# Patient Record
Sex: Male | Born: 1990 | Race: White | Hispanic: No | Marital: Single | Smoking: Current every day smoker
Health system: Southern US, Community
[De-identification: ages and names within clinical notes are randomized; demographics above are authoritative.]

## PROBLEM LIST (undated history)

## (undated) ENCOUNTER — Emergency Department (HOSPITAL_COMMUNITY): Admission: EM | Payer: 59 | Source: Home / Self Care

## (undated) DIAGNOSIS — K219 Gastro-esophageal reflux disease without esophagitis: Secondary | ICD-10-CM

---

## 2013-01-10 ENCOUNTER — Other Ambulatory Visit: Payer: Self-pay | Admitting: *Deleted

## 2013-01-10 ENCOUNTER — Ambulatory Visit (INDEPENDENT_AMBULATORY_CARE_PROVIDER_SITE_OTHER): Payer: No Typology Code available for payment source | Admitting: Emergency Medicine

## 2013-01-10 VITALS — BP 112/72 | HR 72 | Temp 98.5°F | Resp 16 | Ht 64.0 in | Wt 133.0 lb

## 2013-01-10 DIAGNOSIS — Z2089 Contact with and (suspected) exposure to other communicable diseases: Secondary | ICD-10-CM

## 2013-01-10 DIAGNOSIS — Z202 Contact with and (suspected) exposure to infections with a predominantly sexual mode of transmission: Secondary | ICD-10-CM

## 2013-01-10 MED ORDER — CEFPODOXIME PROXETIL 200 MG PO TABS: 400.0000 mg | ORAL_TABLET | Freq: Once | ORAL | Status: DC

## 2013-01-10 MED ORDER — DOXYCYCLINE HYCLATE 100 MG PO CAPS: 100.0000 mg | ORAL_CAPSULE | Freq: Two times a day (BID) | ORAL | Status: DC

## 2013-01-10 NOTE — Patient Instructions (Addendum)
Safer Sex  Your caregiver wants you to have this information about the infections that can be transmitted from sexual contact and how to prevent them. The idea behind safer sex is that you can be sexually active, and at the same time reduce the risk of giving or getting a sexually transmitted disease (STD). Every person should be aware of how to prevent him or herself and his or her sex partner from getting an STD.  CAUSES OF STDS  STDs are transmitted by sharing body fluids, which contain viruses and bacteria. The following fluids all transmit infections during sexual intercourse and sex acts:  · Semen.  · Saliva.  · Urine.  · Blood.  · Vaginal mucus.  Examples of STDs include:  · Chlamydia.  · Gonorrhea.  · Genital herpes.  · Hepatitis B.  · Human immunodeficiency virus or acquired immunodeficiency syndrome (HIV or AIDS).  · Syphilis.  · Trichomonas.  · Pubic lice.  · Human papillomavirus (HPV), which may include:  · Genital warts.  · Cervical dysplasia.  · Cervical cancer (can develop with certain types of HPV).  SYMPTOMS   Sexual diseases often cause few or no symptoms until they are advanced, so a person can be infected and spread the infection without knowing it. Some STDs respond to treatment very well. Others, like HIV and herpes, cannot be cured, but are treated to reduce their effects.  Specific symptoms include:  · Abnormal vaginal discharge.  · Irritation or itching in and around the vagina, and in the pubic hair.  · Pain during sexual intercourse.  · Bleeding during sexual intercourse.  · Pelvic or abdominal pain.  · Fever.  · Growths in and around the vagina.  · An ulcer in or around the vagina.  · Swollen glands in the groin area.  DIAGNOSIS   · Blood tests.  · Pap test.  · Culture test of abnormal vaginal discharge.  · A test that applies a solution and examines the cervix with a lighted magnifying scope (colposcopy).  · A test that examines the pelvis with a lighted tube, through a small incision  (laparoscopy).  TREATMENT   The treatment will depend on the cause of the STD.  · Antibiotic treatment by injection, oral, creams, or suppositories in the vagina.  · Over-the-counter medicated shampoo, to get rid of pubic lice.  · Removing or treating growths with medicine, freezing, burning (electrocautery), or surgery.  · Surgery treatment for HPV of the cervix.  · Supportive medicines for herpes, HIV, AIDS, and hepatitis.  Being careful cannot eliminate all risk of infection, but sex can be made much safer.  Safe sexual practices include body massage and gentle touching. Masturbation is safe, as long as body fluids do not contact skin that has sores or cuts. Dry kissing and oral sex on a man wearing a latex condom or on a woman wearing a male condom is also safe. Slightly less safe is intercourse while the man wears a latex condom or wet kissing. It is also safer to have one sex partner that you know is not having sex with anyone else.  LENGTH OF ILLNESS  An STD might be treated and cured in a week, sometimes a month, or more. And it can linger with symptoms for many years. STDs can also cause damage to the male organs. This can cause chronic pain, infertility, and recurrence of the STD, especially herpes, hepatitis, HIV, and HPV.  HOME CARE INSTRUCTIONS AND PREVENTION  · Alcohol   and recreational drugs are often the reason given for not practicing safer sex. These substances affect your judgment. Alcohol and recreational drugs can also impair your immune system, making you more vulnerable to disease.  · Do not engage in risky and dangerous sexual practices, including:  · Vaginal or anal sex without a condom.  · Oral sex on a man without a condom.  · Oral sex on a woman without a male condom.  · Using saliva to lubricate a condom.  · Any other sexual contact in which body fluids or blood from one partner contact the other partner.  · You should use only latex condoms for men and water soluble lubricants.  Petroleum based lubricants or oils used to lubricate a condom will weaken the condom and increase the chance that it will break.  · Think very carefully before having sex with anyone who is high risk for STDs and HIV. This includes IV drug users, people with multiple sexual partners, or people who have had an STD, or a positive hepatitis or HIV blood test.  · Remember that even if your partner has had only one previous partner, their previous partner might have had multiple partners. If so, you are at high risk of being exposed to an STD. You and your sex partner should be the only sex partners with each other, with no one else involved.  · A vaccine is available for hepatitis B and HPV through your caregiver or the Public Health Department. Everyone should be vaccinated with these vaccines.  · Avoid risky sex practices. Sex acts that can break the skin make you more likely to get an STD.  SEEK MEDICAL CARE IF:   · If you think you have an STD, even if you do not have any symptoms. Contact your caregiver for evaluation and treatment, if needed.  · You think or know your sex partner has acquired an STD.  · You have any of the symptoms mentioned above.  Document Released: 12/16/2004 Document Revised: 01/31/2012 Document Reviewed: 10/08/2009  [employer]® Patient Information ©2013 [employer].

## 2013-01-10 NOTE — Addendum Note (Signed)
Addended by: Carmelina Dane on: 01/10/2013 02:44 PM   Modules accepted: Orders

## 2013-01-10 NOTE — Progress Notes (Addendum)
[facility] [address], [place] [address] [phone]  Date:  01/10/2013   Name:  Charles Barron   DOB:  1991-04-03   MRN:  [id number]  PCP:  No primary provider on file.    Chief Complaint: Exposure to STD   History of Present Illness:  Charles Barron is a 22 y.o. very pleasant male patient who presents with the following:  Asymptomatic following exposure to chlamydia.  His sexual partner was treated for chlamydia and they have had unprotected sex.  He is asymptomatic and has never had an STD.  Denies rash, blisters, discharge or dysuria.  No other complaints or health concerns.  Counseled regarding STD and safe sex practices  There is no problem list on file for this patient.   History reviewed. No pertinent past medical history.  History reviewed. No pertinent past surgical history.  History  Substance Use Topics  . Smoking status: Never Smoker   . Smokeless tobacco: Not on file  . Alcohol Use: Yes    No family history on file.  No Known Allergies  Medication list has been reviewed and updated.  No current outpatient prescriptions on file prior to visit.   No current facility-administered medications on file prior to visit.    Review of Systems:  As per HPI, otherwise negative.    Physical Examination: Filed Vitals:   01/10/13 1433  BP: 112/72  Pulse: 72  Temp: 98.5 F (36.9 C)  Resp: 16   Filed Vitals:   01/10/13 1433  Height: 5\' 4"  (1.626 m)  Weight: 133 lb (60.328 kg)   Body mass index is 22.82 kg/(m^2). Ideal Body Weight: Weight in (lb) to have BMI = 25: 145.3   GEN: WDWN, NAD, Non-toxic, Alert & Oriented x 3 HEENT: Atraumatic, Normocephalic.  Ears and Nose: No external deformity. EXTR: No clubbing/cyanosis/edema NEURO: Normal gait.  PSYCH: Normally interactive. Conversant. Not depressed or anxious appearing.  Calm demeanor.  Genitalia normal male  Assessment and Plan: STD exposure  Carmelina Dane,  MD  Requests dermatology referral for a facial rash

## 2013-01-11 LAB — HIV ANTIBODY (ROUTINE TESTING W REFLEX): HIV: NONREACTIVE

## 2015-04-12 ENCOUNTER — Encounter (HOSPITAL_COMMUNITY): Payer: Self-pay | Admitting: *Deleted

## 2015-04-12 ENCOUNTER — Emergency Department (HOSPITAL_COMMUNITY)
Admission: EM | Admit: 2015-04-12 | Discharge: 2015-04-13 | Disposition: A | Discharge status: Home / Self Care | Payer: 59 | Attending: Emergency Medicine | Admitting: Emergency Medicine

## 2015-04-12 ENCOUNTER — Emergency Department (HOSPITAL_COMMUNITY): Payer: 59

## 2015-04-12 DIAGNOSIS — R110 Nausea: Secondary | ICD-10-CM | POA: Insufficient documentation

## 2015-04-12 DIAGNOSIS — N200 Calculus of kidney: Secondary | ICD-10-CM | POA: Diagnosis not present

## 2015-04-12 DIAGNOSIS — K4090 Unilateral inguinal hernia, without obstruction or gangrene, not specified as recurrent: Secondary | ICD-10-CM | POA: Insufficient documentation

## 2015-04-12 DIAGNOSIS — Z792 Long term (current) use of antibiotics: Secondary | ICD-10-CM | POA: Diagnosis not present

## 2015-04-12 DIAGNOSIS — R1031 Right lower quadrant pain: Secondary | ICD-10-CM | POA: Diagnosis present

## 2015-04-12 MED ORDER — ONDANSETRON 4 MG PO TBDP
4.0000 mg | ORAL_TABLET | Freq: Once | ORAL | Status: DC
  Filled 2015-04-12: qty 1

## 2015-04-12 MED ORDER — OXYCODONE-ACETAMINOPHEN 5-325 MG PO TABS: 1.0000 | ORAL_TABLET | ORAL | Status: DC | PRN

## 2015-04-12 MED ORDER — OXYCODONE-ACETAMINOPHEN 5-325 MG PO TABS
2.0000 | ORAL_TABLET | Freq: Once | ORAL | Status: AC
  Administered 2015-04-12: 2 via ORAL
  Filled 2015-04-12: qty 2

## 2015-04-12 MED ORDER — ONDANSETRON 4 MG PO TBDP: 4.0000 mg | ORAL_TABLET | Freq: Three times a day (TID) | ORAL | Status: DC | PRN

## 2015-04-12 NOTE — ED Provider Notes (Signed)
TIME SEEN: 11:06 PM  CHIEF COMPLAINT: Inguinal Hernia  HPI: HPI Comments: Charles Barron is a 24 y.o. male who presents to the Emergency Department complaining of worsening pain to his right inguinal area that began 1 day ago. Pt reports Hx of right inguinal hernia for the past 12 years. Pt does report slight nausea, but states this is due to not eating as much today. 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 Pt states that he has tried percocet with slight relief. Pt denies fever, chills, vomiting, diarrhea, blood in stool, dysuria, hematuria, or penile discharge. Pt denies Hx of abdominal surgeries. Has not followed up with a surgeon for this hernia.   ROS: See HPI Constitutional: no fever  Eyes: no drainage  ENT: no runny nose   Cardiovascular:  no chest pain  Resp: no SOB  GI: no vomiting GU: no dysuria Integumentary: no rash  Allergy: no hives  Musculoskeletal: no leg swelling  Neurological: no slurred speech ROS otherwise negative  PAST MEDICAL HISTORY/PAST SURGICAL HISTORY:  History reviewed. No pertinent past medical history.  MEDICATIONS:  Prior to Admission medications   Medication Sig Start Date End Date Taking? Authorizing Provider  cefpodoxime (VANTIN) 200 MG tablet Take 2 tablets (400 mg total) by mouth once. 01/10/13   Carmelina Dane, MD  doxycycline (VIBRAMYCIN) 100 MG capsule Take 1 capsule (100 mg total) by mouth 2 (two) times daily. 01/10/13   Carmelina Dane, MD    ALLERGIES:  No Known Allergies  SOCIAL HISTORY:  History  Substance Use Topics  . Smoking status: Never Smoker   . Smokeless tobacco: Not on file  . Alcohol Use: Yes    FAMILY HISTORY: History reviewed. No pertinent family history.  EXAM: BP 132/85 mmHg  Pulse 84  Temp(Src) 97.8 F (36.6 C) (Oral)  Resp 18  Ht  (1.626 m)  Wt 135 lb (61.236 kg)  BMI 23.16 kg/m2  SpO2 99%   CONSTITUTIONAL: Alert and oriented and responds appropriately to questions.  Well-appearing; well-nourished; non toxic appearing. HEAD: Normocephalic EYES: Conjunctivae clear, PERRL ENT: normal nose; no rhinorrhea; moist mucous membranes; pharynx without lesions noted NECK: Supple, no meningismus, no LAD  CARD: RRR; S1 and S2 appreciated; no murmurs, no clicks, no rubs, no gallops RESP: Normal chest excursion without splinting or tachypnea; breath sounds clear and equal bilaterally; no wheezes, no rhonchi, no rales, no hypoxia or respiratory distress, speaking full sentences ABD/GI: Normal bowel sounds; non-distended; soft, non-tender, no rebound, no guarding, no peritoneal signs; no tenderness at McBurney's point.  GU: circumcised male, normal external genitalia, normal urethra meatus without blood or discharge, no testicular masses or tenderness, pt does have a small right inguinal hernia only present with valsalva, no left inguinal hernia present.  BACK:  The back appears normal and is non-tender to palpation, there is no CVA tenderness EXT: Normal ROM in all joints; non-tender to palpation; no edema; normal capillary refill; no cyanosis, no calf tenderness or swelling    SKIN: Normal color for age and race; warm NEURO: Moves all extremities equally, sensation to light touch intact diffusely, cranial nerves II through XII intact PSYCH: The patient's mood and manner are appropriate. Grooming and personal hygiene are appropriate.  MEDICAL DECISION MAKING: Patient here with right inguinal hernia that is easily reproducible on exam. He is afebrile, hemodynamically stable, nontoxic with benign abdominal exam. No testicular pain or masses. Pain is been intermittent for 12 years. He is requesting surgery outpatient  follow-up information. An abdominal x-ray was ordered in triage and shows a possible 5 mm obstructing stone at the proximal right ureter he has no history of kidney stones and does not clinically appear to have a kidney stone. Have offered to perform labs, urine, CT to  confirm but he declines this at this time which I feel is appropriate given it will not change his disposition given he is so well-appearing. Have given outpatient follow up information for general surgery as well as urology. We'll discharge with prescriptions for Percocet and Zofran. Discussed return precautions. He verbalizes understanding and is comfortable with this plan.    I personally performed the services described in this documentation, which was scribed in my presence. The recorded information has been reviewed and is accurate.    Layla MawKristen N Neilan Rizzo, DO 04/13/15 [phone]

## 2015-04-12 NOTE — ED Notes (Signed)
Pt in c/o increased pain to his right inguinal hernia since Friday, pt states he was loading a washing machine when pain started, increased swelling to area

## 2015-04-12 NOTE — ED Notes (Signed)
Registration at bedside.

## 2015-04-12 NOTE — Discharge Instructions (Signed)
Inguinal Hernia, Adult Muscles help keep everything in the body in its proper place. But if a weak spot in the muscles develops, something can poke through. That is called a hernia. When this happens in the lower part of the belly (abdomen), it is called an inguinal hernia. (It takes its name from a part of the body in this region called the inguinal canal.) A weak spot in the wall of muscles lets some fat or part of the small intestine bulge through. An inguinal hernia can develop at any age. Men get them more often than women. CAUSES  In adults, an inguinal hernia develops over time.  It can be triggered by:  Suddenly straining the muscles of the lower abdomen.  Lifting heavy objects.  Straining to have a bowel movement. Difficult bowel movements (constipation) can lead to this.  Constant coughing. This may be caused by smoking or lung disease.  Being overweight.  Being pregnant.  Working at a job that requires long periods of standing or heavy lifting.  Having had an inguinal hernia before. One type can be an emergency situation. It is called a strangulated inguinal hernia. It develops if part of the small intestine slips through the weak spot and cannot get back into the abdomen. The blood supply can be cut off. If that happens, part of the intestine may die. This situation requires emergency surgery. SYMPTOMS  Often, a small inguinal hernia has no symptoms. It is found when a healthcare provider does a physical exam. Larger hernias usually have symptoms.   In adults, symptoms may include:  A lump in the groin. This is easier to see when the person is standing. It might disappear when lying down.  In men, a lump in the scrotum.  Pain or burning in the groin. This occurs especially when lifting, straining or coughing.  A dull ache or feeling of pressure in the groin.  Signs of a strangulated hernia can include:  A bulge in the groin that becomes very painful and tender to the  touch.  A bulge that turns red or purple.  Fever, nausea and vomiting.  Inability to have a bowel movement or to pass gas. DIAGNOSIS  To decide if you have an inguinal hernia, a healthcare provider will probably do a physical examination.  This will include asking questions about any symptoms you have noticed.  The healthcare provider might feel the groin area and ask you to cough. If an inguinal hernia is felt, the healthcare provider may try to slide it back into the abdomen.  Usually no other tests are needed. TREATMENT  Treatments can vary. The size of the hernia makes a difference. Options include:  Watchful waiting. This is often suggested if the hernia is small and you have had no symptoms.  No medical procedure will be done unless symptoms develop.  You will need to watch closely for symptoms. If any occur, contact your healthcare provider right away.  Surgery. This is used if the hernia is larger or you have symptoms.  Open surgery. This is usually an outpatient procedure (you will not stay overnight in a hospital). An cut (incision) is made through the skin in the groin. The hernia is put back inside the abdomen. The weak area in the muscles is then repaired by herniorrhaphy or hernioplasty. Herniorrhaphy: in this type of surgery, the weak muscles are sewn back together. Hernioplasty: a patch or mesh is used to close the weak area in the abdominal wall.  Laparoscopy.  In this procedure, a surgeon makes small incisions. A thin tube with a tiny video camera (called a laparoscope) is put into the abdomen. The surgeon repairs the hernia with mesh by looking with the video camera and using two long instruments. HOME CARE INSTRUCTIONS   After surgery to repair an inguinal hernia:  You will need to take pain medicine prescribed by your healthcare provider. Follow all directions carefully.  You will need to take care of the wound from the incision.  Your activity will be  restricted for awhile. This will probably include no heavy lifting for several weeks. You also should not do anything too active for a few weeks. When you can return to work will depend on the type of job that you have.  During "watchful waiting" periods, you should:  Maintain a healthy weight.  Eat a diet high in fiber (fruits, vegetables and whole grains).  Drink plenty of fluids to avoid constipation. This means drinking enough water and other liquids to keep your urine clear or pale yellow.  Do not lift heavy objects.  Do not stand for long periods of time.  Quit smoking. This should keep you from developing a frequent cough. SEEK MEDICAL CARE IF:   A bulge develops in your groin area.  You feel pain, a burning sensation or pressure in the groin. This might be worse if you are lifting or straining.  You develop a fever of more than 100.5 F (38.1 C). SEEK IMMEDIATE MEDICAL CARE IF:   Pain in the groin increases suddenly.  A bulge in the groin gets bigger suddenly and does not go down.  For men, there is sudden pain in the scrotum. Or, the size of the scrotum increases.  A bulge in the groin area becomes red or purple and is painful to touch.  You have nausea or vomiting that does not go away.  You feel your heart beating much faster than normal.  You cannot have a bowel movement or pass gas.  You develop a fever of more than 102.0 F (38.9 C). Document Released: 03/27/2009 Document Revised: 01/31/2012 Document Reviewed: 03/27/2009 [facility] Patient Information 2015 [place], [place]. This information is not intended to replace advice given to you by your health care provider. Make sure you discuss any questions you have with your health care provider.  Dietary Guidelines to Help Prevent Kidney Stones Your risk of kidney stones can be decreased by adjusting the foods you eat. The most important thing you can do is drink enough fluid. You should drink enough fluid to keep  your urine clear or pale yellow. The following guidelines provide specific information for the type of kidney stone you have had. GUIDELINES ACCORDING TO TYPE OF KIDNEY STONE Calcium Oxalate Kidney Stones  Reduce the amount of salt you eat. Foods that have a lot of salt cause your body to release excess calcium into your urine. The excess calcium can combine with a substance called oxalate to form kidney stones.  Reduce the amount of animal protein you eat if the amount you eat is excessive. Animal protein causes your body to release excess calcium into your urine. Ask your dietitian how much protein from animal sources you should be eating.  Avoid foods that are high in oxalates. If you take vitamins, they should have less than 500 mg of vitamin C. Your body turns vitamin C into oxalates. You do not need to avoid fruits and vegetables high in vitamin C. Calcium Phosphate Kidney Stones  Reduce the amount of salt you eat to help prevent the release of excess calcium into your urine.  Reduce the amount of animal protein you eat if the amount you eat is excessive. Animal protein causes your body to release excess calcium into your urine. Ask your dietitian how much protein from animal sources you should be eating.  Get enough calcium from food or take a calcium supplement (ask your dietitian for recommendations). Food sources of calcium that do not increase your risk of kidney stones include:  Broccoli.  Dairy products, such as cheese and yogurt.  Pudding. Uric Acid Kidney Stones  Do not have more than 6 oz of animal protein per day. FOOD SOURCES Animal Protein Sources  Meat (all types).  Poultry.  Eggs.  Fish, seafood. Foods High in [employer] seasonings.  Soy sauce.  Teriyaki sauce.  Cured and processed meats.  Salted crackers and snack foods.  Fast food.  Canned soups and most canned foods. Foods High in Oxalates  Grains:  Amaranth.  Barley.  Grits.  Wheat  germ.  Bran.  Buckwheat flour.  All bran cereals.  Pretzels.  Whole wheat bread.  Vegetables:  Beans (wax).  Beets and beet greens.  Collard greens.  Eggplant.  [place]scarole.  Leeks.  Okra.  Parsley.  Rutabagas.  Spinach.  Swiss chard.  Tomato paste.  Fried potatoes.  Sweet potatoes.  Fruits:  Red currants.  Figs.  Kiwi.  Rhubarb.  Meat and Other Protein Sources:  Beans (dried).  Soy burgers and other soybean products.  Miso.  Nuts (peanuts, almonds, pecans, cashews, hazelnuts).  Nut butters.  Sesame seeds and tahini (paste made of sesame seeds).  Poppy seeds.  Beverages:  Chocolate drink mixes.  Soy milk.  Instant iced tea.  Juices made from high-oxalate fruits or vegetables.  Other:  Carob.  Chocolate.  Fruitcake.  Marmalades. Document Released: 03/05/2011 Document Revised: 11/13/2013 Document Reviewed: 10/05/2013 [facility] Patient Information 2015 [place], [place]. This information is not intended to replace advice given to you by your health care provider. Make sure you discuss any questions you have with your health care provider.  Kidney Stones Kidney stones (urolithiasis) are deposits that form inside your kidneys. The intense pain is caused by the stone moving through the urinary tract. When the stone moves, the ureter goes into spasm around the stone. The stone is usually passed in the urine.  CAUSES   A disorder that makes certain neck glands produce too much parathyroid hormone (primary hyperparathyroidism).  A buildup of uric acid crystals, similar to gout in your joints.  Narrowing (stricture) of the ureter.  A kidney obstruction present at birth (congenital obstruction).  Previous surgery on the kidney or ureters.  Numerous kidney infections. SYMPTOMS   Feeling sick to your stomach (nauseous).  Throwing up (vomiting).  Blood in the urine (hematuria).  Pain that usually spreads (radiates) to the  groin.  Frequency or urgency of urination. DIAGNOSIS   Taking a history and physical exam.  Blood or urine tests.  CT scan.  Occasionally, an examination of the inside of the urinary bladder (cystoscopy) is performed. TREATMENT   Observation.  Increasing your fluid intake.  Extracorporeal shock wave lithotripsy--This is a noninvasive procedure that uses shock waves to break up kidney stones.  Surgery may be needed if you have severe pain or persistent obstruction. There are various surgical procedures. Most of the procedures are performed with the use of small instruments. Only small incisions are needed to accommodate these  instruments, so recovery time is minimized. The size, location, and chemical composition are all important variables that will determine the proper choice of action for you. Talk to your health care provider to better understand your situation so that you will minimize the risk of injury to yourself and your kidney.  HOME CARE INSTRUCTIONS   Drink enough water and fluids to keep your urine clear or pale yellow. This will help you to pass the stone or stone fragments.  Strain all urine through the provided strainer. Keep all particulate matter and stones for your health care provider to see. The stone causing the pain may be as small as a grain of salt. It is very important to use the strainer each and every time you pass your urine. The collection of your stone will allow your health care provider to analyze it and verify that a stone has actually passed. The stone analysis will often identify what you can do to reduce the incidence of recurrences.  Only take over-the-counter or prescription medicines for pain, discomfort, or fever as directed by your health care provider.  Make a follow-up appointment with your health care provider as directed.  Get follow-up X-rays if required. The absence of pain does not always mean that the stone has passed. It may have only  stopped moving. If the urine remains completely obstructed, it can cause loss of kidney function or even complete destruction of the kidney. It is your responsibility to make sure X-rays and follow-ups are completed. Ultrasounds of the kidney can show blockages and the status of the kidney. Ultrasounds are not associated with any radiation and can be performed easily in a matter of minutes. SEEK MEDICAL CARE IF:  You experience pain that is progressive and unresponsive to any pain medicine you have been prescribed. SEEK IMMEDIATE MEDICAL CARE IF:   Pain cannot be controlled with the prescribed medicine.  You have a fever or shaking chills.  The severity or intensity of pain increases over 18 hours and is not relieved by pain medicine.  You develop a new onset of abdominal pain.  You feel faint or pass out.  You are unable to urinate. MAKE SURE YOU:   Understand these instructions.  Will watch your condition.  Will get help right away if you are not doing well or get worse. Document Released: 11/08/2005 Document Revised: 07/11/2013 Document Reviewed: 04/11/2013 [facility] Patient Information 2015 [place], [place]. This information is not intended to replace advice given to you by your health care provider. Make sure you discuss any questions you have with your health care provider.

## 2015-04-12 NOTE — ED Notes (Signed)
Pt states he was lifting a washing machine on Friday and started having pain where his right inguinal hernia is. Reports that he has had this hernia x 12 years

## 2015-04-14 ENCOUNTER — Encounter (HOSPITAL_COMMUNITY): Payer: Self-pay | Admitting: Emergency Medicine

## 2015-04-14 ENCOUNTER — Emergency Department (HOSPITAL_COMMUNITY): Payer: 59

## 2015-04-14 ENCOUNTER — Emergency Department (HOSPITAL_COMMUNITY)
Admission: EM | Admit: 2015-04-14 | Discharge: 2015-04-14 | Disposition: A | Discharge status: Home / Self Care | Payer: 59 | Attending: Emergency Medicine | Admitting: Emergency Medicine

## 2015-04-14 DIAGNOSIS — N200 Calculus of kidney: Secondary | ICD-10-CM | POA: Diagnosis not present

## 2015-04-14 DIAGNOSIS — Z79899 Other long term (current) drug therapy: Secondary | ICD-10-CM | POA: Insufficient documentation

## 2015-04-14 DIAGNOSIS — R109 Unspecified abdominal pain: Secondary | ICD-10-CM | POA: Diagnosis present

## 2015-04-14 LAB — CBC WITH DIFFERENTIAL/PLATELET
BASOS ABS: 0 10*3/uL (ref 0.0–0.1)
BASOS PCT: 1 % (ref 0–1)
EOS PCT: 3 % (ref 0–5)
Eosinophils Absolute: 0.2 10*3/uL (ref 0.0–0.7)
HCT: 43.4 % (ref 39.0–52.0)
HEMOGLOBIN: 15.3 g/dL (ref 13.0–17.0)
LYMPHS ABS: 1.5 10*3/uL (ref 0.7–4.0)
LYMPHS PCT: 26 % (ref 12–46)
MCH: 31.1 pg (ref 26.0–34.0)
MCHC: 35.3 g/dL (ref 30.0–36.0)
MCV: 88.2 fL (ref 78.0–100.0)
MONOS PCT: 12 % (ref 3–12)
Monocytes Absolute: 0.7 10*3/uL (ref 0.1–1.0)
NEUTROS ABS: 3.4 10*3/uL (ref 1.7–7.7)
NEUTROS PCT: 58 % (ref 43–77)
PLATELETS: 215 10*3/uL (ref 150–400)
RBC: 4.92 MIL/uL (ref 4.22–5.81)
RDW: 12.7 % (ref 11.5–15.5)
WBC: 5.8 10*3/uL (ref 4.0–10.5)

## 2015-04-14 LAB — COMPREHENSIVE METABOLIC PANEL
ALT: 14 U/L — ABNORMAL LOW (ref 17–63)
AST: 19 U/L (ref 15–41)
Albumin: 4.2 g/dL (ref 3.5–5.0)
Alkaline Phosphatase: 42 U/L (ref 38–126)
Anion gap: 8 (ref 5–15)
BILIRUBIN TOTAL: 0.9 mg/dL (ref 0.3–1.2)
BUN: 10 mg/dL (ref 6–20)
CALCIUM: 8.8 mg/dL — AB (ref 8.9–10.3)
CHLORIDE: 105 mmol/L (ref 101–111)
CO2: 25 mmol/L (ref 22–32)
CREATININE: 0.85 mg/dL (ref 0.61–1.24)
GFR calc Af Amer: >60 mL/min (ref >60)
Glucose, Bld: 118 mg/dL — ABNORMAL HIGH (ref 65–99)
POTASSIUM: 3.8 mmol/L (ref 3.5–5.1)
Sodium: 138 mmol/L (ref 135–145)
Total Protein: 6.3 g/dL — ABNORMAL LOW (ref 6.5–8.1)

## 2015-04-14 LAB — URINALYSIS, ROUTINE W REFLEX MICROSCOPIC
BILIRUBIN URINE: NEGATIVE
GLUCOSE, UA: NEGATIVE mg/dL
Ketones, ur: NEGATIVE mg/dL
LEUKOCYTES UA: NEGATIVE
NITRITE: NEGATIVE
PROTEIN: NEGATIVE mg/dL
SPECIFIC GRAVITY, URINE: 1.017 (ref 1.005–1.030)
UROBILINOGEN UA: 0.2 mg/dL (ref 0.0–1.0)
pH: 8.5 — ABNORMAL HIGH (ref 5.0–8.0)

## 2015-04-14 LAB — URINE MICROSCOPIC-ADD ON

## 2015-04-14 MED ORDER — HYDROMORPHONE HCL 1 MG/ML IJ SOLN
1.0000 mg | Freq: Once | INTRAMUSCULAR | Status: AC
  Administered 2015-04-14: 1 mg via INTRAVENOUS
  Filled 2015-04-14: qty 1

## 2015-04-14 MED ORDER — KETOROLAC TROMETHAMINE 30 MG/ML IJ SOLN
30.0000 mg | Freq: Once | INTRAMUSCULAR | Status: AC
  Administered 2015-04-14: 30 mg via INTRAVENOUS
  Filled 2015-04-14: qty 1

## 2015-04-14 MED ORDER — FENTANYL CITRATE (PF) 100 MCG/2ML IJ SOLN
50.0000 ug | Freq: Once | INTRAMUSCULAR | Status: AC
  Administered 2015-04-14: 50 ug via INTRAVENOUS
  Filled 2015-04-14: qty 2

## 2015-04-14 MED ORDER — MORPHINE SULFATE 4 MG/ML IJ SOLN
4.0000 mg | Freq: Once | INTRAMUSCULAR | Status: AC
  Administered 2015-04-14: 4 mg via INTRAVENOUS
  Filled 2015-04-14: qty 1

## 2015-04-14 MED ORDER — SODIUM CHLORIDE 0.9 % IV BOLUS (SEPSIS)
1000.0000 mL | Freq: Once | INTRAVENOUS | Status: AC
  Administered 2015-04-14: 1000 mL via INTRAVENOUS

## 2015-04-14 MED ORDER — OXYCODONE-ACETAMINOPHEN 5-325 MG PO TABS: 2.0000 | ORAL_TABLET | ORAL | Status: DC | PRN

## 2015-04-14 MED ORDER — ONDANSETRON HCL 4 MG/2ML IJ SOLN
4.0000 mg | Freq: Once | INTRAMUSCULAR | Status: DC
  Filled 2015-04-14: qty 2

## 2015-04-14 NOTE — ED Notes (Signed)
Patient transported to CT 

## 2015-04-14 NOTE — Discharge Instructions (Signed)
Take Percocet as needed for pain. Refer to attached documents for more information. Return to the ED with worsening or concerning symptoms.  °

## 2015-04-14 NOTE — ED Notes (Signed)
Pt arrives with R sided flank pain and R sided groin pain, woke up with it. Seen yesterday for same. Urinary urgency, nausea.

## 2015-04-14 NOTE — ED Notes (Signed)
Pt sitting on floor, balled over in pain. Pt sts pain 10/10

## 2015-04-14 NOTE — ED Notes (Signed)
Pt noted to be feeling better. Pt laying in bed resting.

## 2015-04-14 NOTE — ED Provider Notes (Signed)
CSN: [id number]     Arrival date & time 04/14/15  4098 History   First MD Initiated Contact with Patient 04/14/15 [phone]     Chief Complaint  Patient presents with  . Flank Pain     (Consider location/radiation/quality/duration/timing/severity/associated sxs/prior Treatment) HPI Comments: Patient is a 24 year old male who presents with flank pain that started prior to arrival. The pain is located in the right flank and radiates to his right abdomen. The pain is described as sharp and severe. The pain started gradually and progressively worsened since the onset. No alleviating/aggravating factors. The patient has tried Percocet and zofran for symptoms without relief. Associated symptoms include dysuria, nausea, and vomiting. Patient denies fever, headache, diarrhea, chest pain, SOB, constipation.     Patient is a 24 y.o. male presenting with flank pain.  Flank Pain Pertinent negatives include no abdominal pain, arthralgias, chest pain, chills, fatigue, fever, nausea, neck pain, vomiting or weakness.    History reviewed. No pertinent past medical history. History reviewed. No pertinent past surgical history. No family history on file. History  Substance Use Topics  . Smoking status: Never Smoker   . Smokeless tobacco: Not on file  . Alcohol Use: Yes    Review of Systems  Constitutional: Negative for fever, chills and fatigue.  HENT: Negative for trouble swallowing.   Eyes: Negative for visual disturbance.  Respiratory: Negative for shortness of breath.   Cardiovascular: Negative for chest pain and palpitations.  Gastrointestinal: Negative for nausea, vomiting, abdominal pain and diarrhea.  Genitourinary: Positive for dysuria and flank pain. Negative for difficulty urinating.  Musculoskeletal: Negative for arthralgias and neck pain.  Skin: Negative for color change.  Neurological: Negative for dizziness and weakness.  Psychiatric/Behavioral: Negative for dysphoric mood.       Allergies  Review of patient's allergies indicates no known allergies.  Home Medications   Prior to Admission medications   Medication Sig Start Date End Date Taking? Authorizing Provider  ondansetron (ZOFRAN ODT) 4 MG disintegrating tablet Take 1 tablet (4 mg total) by mouth every 8 (eight) hours as needed for nausea or vomiting. 04/12/15   Kristen N Ward, DO  oxyCODONE-acetaminophen (PERCOCET/ROXICET) 5-325 MG per tablet Take 1 tablet by mouth every 4 (four) hours as needed. 04/12/15   Kristen N Ward, DO   BP 125/63 mmHg  Pulse 85  Temp(Src) 98.7 F (37.1 C) (Oral)  Resp 23  SpO2 95% Physical Exam  Constitutional: He is oriented to person, place, and time. He appears well-developed and well-nourished. No distress.  HENT:  Head: Normocephalic and atraumatic.  Eyes: Conjunctivae and EOM are normal.  Neck: Normal range of motion.  Cardiovascular: Normal rate and regular rhythm.  Exam reveals no gallop and no friction rub.   No murmur heard. Pulmonary/Chest: Effort normal and breath sounds normal. He has no wheezes. He has no rales. He exhibits no tenderness.  Abdominal: Soft. He exhibits no distension. There is tenderness. There is no rebound.  Right lower abdominal tenderness to palpation. No other focal tenderness or peritoneal signs.   Musculoskeletal: Normal range of motion.  Neurological: He is alert and oriented to person, place, and time. Coordination normal.  Speech is goal-oriented. Moves limbs without ataxia.   Skin: Skin is warm and dry.  Psychiatric: He has a normal mood and affect. His behavior is normal.  Nursing note and vitals reviewed.   ED Course  Procedures (including critical care time) Labs Review Labs Reviewed  URINALYSIS, ROUTINE W REFLEX MICROSCOPIC - Abnormal;  Notable for the following:    pH 8.5 (*)    Hgb urine dipstick MODERATE (*)    All other components within normal limits  URINE MICROSCOPIC-ADD ON - Abnormal; Notable for the following:     Bacteria, UA FEW (*)    All other components within normal limits  COMPREHENSIVE METABOLIC PANEL - Abnormal; Notable for the following:    Glucose, Bld 118 (*)    Calcium 8.8 (*)    Total Protein 6.3 (*)    ALT 14 (*)    All other components within normal limits  CBC WITH DIFFERENTIAL/PLATELET    Imaging Review Ct Abdomen Pelvis Wo Contrast  04/14/2015   CLINICAL DATA:  Right flank pain, right groin pain, urinary urgency, nausea. Known kidney stones and hernia.  EXAM: CT ABDOMEN AND PELVIS WITHOUT CONTRAST  TECHNIQUE: Multidetector CT imaging of the abdomen and pelvis was performed following the standard protocol without IV contrast.  COMPARISON:  Abdominal radiograph dated 04/12/2015  FINDINGS: Lower chest:  Lung bases are clear.  Hepatobiliary: Unenhanced liver is unremarkable.  Gallbladder is unremarkable. No intrahepatic or extrahepatic ductal dilatation.  Pancreas: Within normal limits.  Spleen: Within normal limits.  Adrenals/Urinary Tract: Adrenal glands within normal limits.  4 mm nonobstructing left lower pole renal calculus (series 2/image 30). No hydronephrosis.  2 mm nonobstructing right lower pole renal calculus (series 2/ image 33). Mild perirenal edema. Mild to moderate right hydroureteronephrosis.  5 mm distal right ureteral calculus just above the UVJ (coronal image 48).  Bladder is mildly thick-walled although underdistended.  Stomach/Bowel: Stomach is within normal limits.  No evidence of bowel obstruction.  Normal appendix.  Vascular/Lymphatic: No evidence of abdominal aortic aneurysm.  No suspicious abdominopelvic lymphadenopathy.  Reproductive: Prostate is unremarkable.  Other: No abdominopelvic ascites.  Multiple calcified bilateral pelvic phleboliths.  Tiny fat containing indirect right inguinal hernia (series 2/ image 70).  Musculoskeletal: Visualized osseous structures are within normal limits.  IMPRESSION: 5 mm distal right ureteral calculus just above the UVJ. Mild to  moderate right hydroureteronephrosis.  Bilateral nonobstructing renal calculi measuring 4 mm in the left lower pole and 2 mm in the right lower pole.   Electronically Signed   By: Charline BillsSriyesh  Krishnan M.D.   On: 04/14/2015 08:18   Dg Abd 1 View  04/12/2015   CLINICAL DATA:  Acute onset of right lower groin pain. Initial encounter.  EXAM: ABDOMEN - 1 VIEW  COMPARISON:  None.  FINDINGS: The visualized bowel gas pattern is unremarkable. Scattered air and stool filled loops of colon are seen; no abnormal dilatation of small bowel loops is seen to suggest small bowel obstruction. No free intra-abdominal air is identified, though evaluation for free air is limited on a single supine view.  An apparent 5 mm stone is noted overlying the proximal right ureter.  The visualized osseous structures are within normal limits; the sacroiliac joints are unremarkable in appearance.  IMPRESSION: 1. Likely obstructing 5 mm stone noted at the proximal right ureter, explaining the patient's right lower groin pain. 2. Unremarkable bowel gas pattern; no free intra-abdominal air seen. Small to moderate amount of stool noted in the colon.   Electronically Signed   By: Roanna RaiderJeffery  Chang M.D.   On: 04/12/2015 23:12     EKG Interpretation None      MDM   Final diagnoses:  Kidney stone on right side    7:40 AM Labs and urinalysis pending. Vitals stable and patient afebrile. Patient will have toradol, morphine, and  zofran. CT abdomen pelvis pending.   11:12 AM CT shows 5mm stone at the right UVJ. Vitals stable and patient afebrile. Urine not infected and creatinine stable. Patient feeling better and will be discharged without further evaluation.    Emilia Beck, PA-C 04/14/15 1114  Doug Sou, MD 04/14/15 [phone]

## 2016-02-23 ENCOUNTER — Emergency Department (HOSPITAL_COMMUNITY)
Admission: EM | Admit: 2016-02-23 | Discharge: 2016-02-23 | Disposition: A | Discharge status: Home / Self Care | Payer: 59 | Attending: Emergency Medicine | Admitting: Emergency Medicine

## 2016-02-23 ENCOUNTER — Encounter (HOSPITAL_COMMUNITY): Payer: Self-pay | Admitting: *Deleted

## 2016-02-23 ENCOUNTER — Emergency Department (HOSPITAL_COMMUNITY): Payer: 59

## 2016-02-23 DIAGNOSIS — Y9351 Activity, roller skating (inline) and skateboarding: Secondary | ICD-10-CM | POA: Insufficient documentation

## 2016-02-23 DIAGNOSIS — S6991XA Unspecified injury of right wrist, hand and finger(s), initial encounter: Secondary | ICD-10-CM | POA: Insufficient documentation

## 2016-02-23 DIAGNOSIS — W1789XA Other fall from one level to another, initial encounter: Secondary | ICD-10-CM | POA: Insufficient documentation

## 2016-02-23 DIAGNOSIS — S52611A Displaced fracture of right ulna styloid process, initial encounter for closed fracture: Secondary | ICD-10-CM | POA: Insufficient documentation

## 2016-02-23 DIAGNOSIS — Y9289 Other specified places as the place of occurrence of the external cause: Secondary | ICD-10-CM | POA: Insufficient documentation

## 2016-02-23 DIAGNOSIS — S52591A Other fractures of lower end of right radius, initial encounter for closed fracture: Secondary | ICD-10-CM | POA: Insufficient documentation

## 2016-02-23 DIAGNOSIS — S5291XA Unspecified fracture of right forearm, initial encounter for closed fracture: Secondary | ICD-10-CM

## 2016-02-23 DIAGNOSIS — Y998 Other external cause status: Secondary | ICD-10-CM | POA: Insufficient documentation

## 2016-02-23 MED ORDER — OXYCODONE-ACETAMINOPHEN 5-325 MG PO TABS
1.0000 | ORAL_TABLET | ORAL | Status: DC | PRN
  Administered 2016-02-23: 1 via ORAL

## 2016-02-23 MED ORDER — OXYCODONE-ACETAMINOPHEN 5-325 MG PO TABS: 1.0000 | ORAL_TABLET | Freq: Four times a day (QID) | ORAL | Status: DC | PRN

## 2016-02-23 MED ORDER — OXYCODONE-ACETAMINOPHEN 5-325 MG PO TABS
ORAL_TABLET | ORAL | Status: DC
Start: 2016-02-23 — End: 2016-02-23
  Filled 2016-02-23: qty 1

## 2016-02-23 NOTE — Progress Notes (Signed)
Orthopedic Tech Progress Note Patient Details:  Charles BreathRobert Schloss 12/06/1990 [id number]  Ortho Devices Type of Ortho Device: Arm sling, Sugartong splint Ortho Device/Splint Interventions: Application   Saul FordyceJennifer C Joycelin Radloff 02/23/2016, 5:11 PM

## 2016-02-23 NOTE — ED Notes (Signed)
Ortho paged. 

## 2016-02-23 NOTE — ED Notes (Signed)
Pt reports falling yesterday and having right wrist/forearm pain and swelling since. Is able to move digits.

## 2016-02-23 NOTE — ED Notes (Signed)
Ortho at bedside.

## 2016-02-23 NOTE — ED Provider Notes (Signed)
CSN: [id number]     Arrival date & time 02/23/16  1420 History  By signing my name below, I, Soijett Blue, attest that this documentation has been prepared under the direction and in the presence of Elizabeth Sauer, PA-C Electronically Signed: Soijett Blue, ED Scribe. 02/23/2016. 3:43 PM.    Chief Complaint  Patient presents with  . Arm Injury    The history is provided by the patient. No language interpreter was used.    Charles Barron is a 25 y.o. male who presents to the Emergency Department complaining of right arm injury occurring 5-6 PM yesterday. Pt states that he fell from 5 feet yesterday while skateboarding onto his braced closed right wrist/forearm. Pt reports that he has had sharp/throbbing constant, moderate, right wrist/forearm pain since the incident.  Pt denies any other injuries. Pt is having associated symptoms of right wrist swelling, tingling to right middle/ring/little finger, and right thumb pain. Pt was given percocet while in the ED for the relief of his symptoms but he denies any treatments at home. He denies wound, LOC, hitting his head, numbness, weakness, abdominal pain, back pain, CP, SOB, and any other symptoms.    History reviewed. No pertinent past medical history. History reviewed. No pertinent past surgical history. History reviewed. No pertinent family history. Social History  Substance Use Topics  . Smoking status: Never Smoker   . Smokeless tobacco: None  . Alcohol Use: Yes    Review of Systems  Respiratory: Negative for shortness of breath.   Cardiovascular: Negative for chest pain.  Gastrointestinal: Negative for abdominal pain.  Musculoskeletal: Positive for joint swelling and arthralgias. Negative for back pain.  Skin: Negative for wound.  Neurological: Negative for syncope, weakness and numbness.       Tingling to right middle/ring/litte fingers  All other systems reviewed and are negative.     Allergies  Review of patient's allergies indicates  no known allergies.  Home Medications   Prior to Admission medications   Medication Sig Start Date End Date Taking? Authorizing Provider  ondansetron (ZOFRAN ODT) 4 MG disintegrating tablet Take 1 tablet (4 mg total) by mouth every 8 (eight) hours as needed for nausea or vomiting. 04/12/15   Kristen N Ward, DO  oxyCODONE-acetaminophen (PERCOCET/ROXICET) 5-325 MG tablet Take 1-2 tablets by mouth every 6 (six) hours as needed for severe pain. 02/23/16   Jaime Pilcher Ward, PA-C   BP 132/91 mmHg  Pulse 111  Temp(Src) 98.3 F (36.8 C) (Oral)  Resp 18  SpO2 99% Physical Exam  Constitutional: He is oriented to person, place, and time. He appears well-developed and well-nourished. No distress.  HENT:  Head: Normocephalic and atraumatic.  Neck: Neck supple.  Cardiovascular: Normal rate, regular rhythm and normal heart sounds.  Exam reveals no gallop and no friction rub.   No murmur heard. Pulmonary/Chest: Effort normal and breath sounds normal. No respiratory distress. He has no wheezes. He has no rales.  Musculoskeletal:  Right wrist with swelling and decreased ROM. No erythema, or warmth overlaying the joint. There is tenderness to palpation over the entire wrist joint. The patient has normal sensation and motor function in the median, ulnar, and radial nerve distributions. There is no anatomic snuff box tenderness. The patient has normal active and passive range of motion of their digits. 2+ Radial pulse.  Neurological: He is alert and oriented to person, place, and time.  Skin: Skin is warm and dry.  Psychiatric: He has a normal mood and affect. His behavior is  normal.  Nursing note and vitals reviewed.   ED Course  Procedures (including critical care time) DIAGNOSTIC STUDIES: Oxygen Saturation is 99% on RA, nl by my interpretation.    COORDINATION OF CARE: 3:42 PM Discussed treatment plan with pt at bedside which includes right wrist xray, right forearm xray, consult to hand surgeon  and pt agreed to plan.  4:06 PM- Consult with Dr. Mina MarbleWeingold, Hand Surgeon who recommends follow up in office tomorrow to schedule surgery for later this week.   Labs Review Labs Reviewed - No data to display  Imaging Review Dg Forearm Right  02/23/2016  CLINICAL DATA:  Larey SeatFell yesterday skating about 45 feet onto RIGHT forearm and wrist, distal forearm pain on thumb side with bruising. EXAM: RIGHT FOREARM - 2 VIEW COMPARISON:  None. FINDINGS: Osseous mineralization normal. Elbow joint alignment normal. Displaced ulnar styloid fracture. Transverse metaphyseal fracture distal RIGHT radius with volar displacement of fragment and intra-articular extension into radiocarpal joint and questionably into the distal radioulnar joint as well. Shafts of radius and ulna appear intact. No elbow joint effusion. Distal forearm soft tissue swelling extending to wrist. IMPRESSION: Displaced ulnar styloid fracture. Displaced distal RIGHT radial metaphyseal fracture with intra-articular extension into radiocarpal joint and probably distal radioulnar joint as well Electronically Signed   By: Ulyses SouthwardMark  Boles M.D.   On: 02/23/2016 15:30   Dg Wrist Complete Right  02/23/2016  CLINICAL DATA:  Fall while skating yesterday. Distal forearm and lateral pain. Bruising. EXAM: RIGHT WRIST - COMPLETE 3+ VIEW COMPARISON:  None. FINDINGS: The comminuted distal radius fracture demonstrates 3-4 mm of lateral displacement. There slight volar angulation. The fracture extends to the articular surface. An ulnar styloid fracture is also noted. The carpal bones are intact. Extensive soft swelling is present. IMPRESSION: 1. Comminuted distal radius fracture with 3- new 4 mm of lateral displacement. 2. Probable intra-arterial extension of the fracture. 3. Mildly displaced ulnar styloid fracture. Electronically Signed   By: Marin Robertshristopher  Mattern M.D.   On: 02/23/2016 15:29   I have personally reviewed and evaluated these images as part of my medical  decision-making.   EKG Interpretation None      MDM   Final diagnoses:  Fracture of ulnar styloid, right, closed, initial encounter  Radial fracture, right, closed, initial encounter   Charles Barron presents to the ED for right wrist pain secondary to fall which occurred yesterday. On exam, wrist with obvious swelling and decreased range of motion. All compartments are soft and extremity is neurovascularly intact. X-rays were obtained which show a comminuted distal radius fracture with lateral displacement along with a mildly displaced ulnar styloid fracture. Orthopedics, Dr. Mina MarbleWeingold, was consulted who recommends splinting and sugar tong splint with follow-up tomorrow. Patient will need surgical repair. Patient was informed of the treatment plan and agrees to call for his follow-up appointment tomorrow. Short course of pain medication to get him to appointment tomorrow was given. Return precautions were given and all questions were answered.  I personally performed the services described in this documentation, which was scribed in my presence. The recorded information has been reviewed and is accurate.   [facility], IncJaime Pilcher Ward, PA-C 02/23/16 1720  Pricilla LovelessScott Goldston, MD 02/24/16 [phone]

## 2016-02-23 NOTE — Discharge Instructions (Signed)
Please call the hand surgeon listed on instructions first thing in the morning to schedule an appointment. You need to be seen tomorrow. Take pain medication only as needed for severe pain- This can make you very drowsy - please do not drink or drive on this medication. Return to ER for any new or worsening symptoms, any additional concerns.

## 2016-02-24 ENCOUNTER — Other Ambulatory Visit: Payer: Self-pay | Admitting: Orthopedic Surgery

## 2016-02-25 ENCOUNTER — Ambulatory Visit (HOSPITAL_BASED_OUTPATIENT_CLINIC_OR_DEPARTMENT_OTHER)
Admission: RE | Admit: 2016-02-25 | Discharge: 2016-02-25 | Disposition: A | Discharge status: Home / Self Care | Payer: 59 | Source: Ambulatory Visit | Attending: Orthopedic Surgery | Admitting: Orthopedic Surgery

## 2016-02-25 ENCOUNTER — Other Ambulatory Visit: Payer: Self-pay | Admitting: Orthopedic Surgery

## 2016-02-25 ENCOUNTER — Encounter (HOSPITAL_BASED_OUTPATIENT_CLINIC_OR_DEPARTMENT_OTHER)
Admission: RE | Disposition: A | Discharge status: Home / Self Care | Payer: Self-pay | Source: Ambulatory Visit | Attending: Orthopedic Surgery

## 2016-02-25 ENCOUNTER — Encounter (HOSPITAL_BASED_OUTPATIENT_CLINIC_OR_DEPARTMENT_OTHER): Payer: Self-pay

## 2016-02-25 ENCOUNTER — Ambulatory Visit (HOSPITAL_BASED_OUTPATIENT_CLINIC_OR_DEPARTMENT_OTHER): Payer: 59 | Admitting: Anesthesiology

## 2016-02-25 DIAGNOSIS — G5601 Carpal tunnel syndrome, right upper limb: Secondary | ICD-10-CM | POA: Insufficient documentation

## 2016-02-25 DIAGNOSIS — F17210 Nicotine dependence, cigarettes, uncomplicated: Secondary | ICD-10-CM | POA: Insufficient documentation

## 2016-02-25 DIAGNOSIS — X58XXXA Exposure to other specified factors, initial encounter: Secondary | ICD-10-CM | POA: Insufficient documentation

## 2016-02-25 DIAGNOSIS — S52571A Other intraarticular fracture of lower end of right radius, initial encounter for closed fracture: Secondary | ICD-10-CM | POA: Insufficient documentation

## 2016-02-25 HISTORY — PX: CARPAL TUNNEL RELEASE: SHX101

## 2016-02-25 HISTORY — PX: OPEN REDUCTION INTERNAL FIXATION (ORIF) DISTAL RADIAL FRACTURE: SHX5989

## 2016-02-25 HISTORY — DX: Gastro-esophageal reflux disease without esophagitis: K21.9

## 2016-02-25 SURGERY — OPEN REDUCTION INTERNAL FIXATION (ORIF) DISTAL RADIUS FRACTURE
Anesthesia: Monitor Anesthesia Care | Site: Wrist | Laterality: Right

## 2016-02-25 MED ORDER — GLYCOPYRROLATE 0.2 MG/ML IJ SOLN: 0.2000 mg | Freq: Once | INTRAMUSCULAR | Status: DC | PRN

## 2016-02-25 MED ORDER — MIDAZOLAM HCL 2 MG/2ML IJ SOLN
1.0000 mg | INTRAMUSCULAR | Status: DC | PRN
  Administered 2016-02-25: 1 mg via INTRAVENOUS
  Administered 2016-02-25: 2 mg via INTRAVENOUS

## 2016-02-25 MED ORDER — FENTANYL CITRATE (PF) 100 MCG/2ML IJ SOLN: 25.0000 ug | INTRAMUSCULAR | Status: DC | PRN

## 2016-02-25 MED ORDER — ONDANSETRON HCL 4 MG/2ML IJ SOLN
INTRAMUSCULAR | Status: DC | PRN
  Administered 2016-02-25: 4 mg via INTRAVENOUS

## 2016-02-25 MED ORDER — PROPOFOL 10 MG/ML IV BOLUS
INTRAVENOUS | Status: AC
  Filled 2016-02-25: qty 20

## 2016-02-25 MED ORDER — OXYCODONE-ACETAMINOPHEN 5-325 MG PO TABS: 1.0000 | ORAL_TABLET | ORAL | Status: DC | PRN

## 2016-02-25 MED ORDER — FENTANYL CITRATE (PF) 100 MCG/2ML IJ SOLN
INTRAMUSCULAR | Status: AC
  Filled 2016-02-25: qty 2

## 2016-02-25 MED ORDER — BUPIVACAINE-EPINEPHRINE (PF) 0.5% -1:200000 IJ SOLN
INTRAMUSCULAR | Status: DC | PRN
  Administered 2016-02-25: 30 mL via PERINEURAL

## 2016-02-25 MED ORDER — BUPIVACAINE HCL (PF) 0.25 % IJ SOLN
INTRAMUSCULAR | Status: AC
  Filled 2016-02-25: qty 30

## 2016-02-25 MED ORDER — BUPIVACAINE-EPINEPHRINE (PF) 0.25% -1:200000 IJ SOLN
INTRAMUSCULAR | Status: AC
  Filled 2016-02-25: qty 30

## 2016-02-25 MED ORDER — CEFAZOLIN SODIUM-DEXTROSE 2-4 GM/100ML-% IV SOLN: 2.0000 g | INTRAVENOUS | Status: DC

## 2016-02-25 MED ORDER — LIDOCAINE HCL (CARDIAC) 20 MG/ML IV SOLN
INTRAVENOUS | Status: AC
  Filled 2016-02-25: qty 5

## 2016-02-25 MED ORDER — PROPOFOL 500 MG/50ML IV EMUL
INTRAVENOUS | Status: AC
  Filled 2016-02-25: qty 50

## 2016-02-25 MED ORDER — ONDANSETRON HCL 4 MG/2ML IJ SOLN
INTRAMUSCULAR | Status: AC
  Filled 2016-02-25: qty 2

## 2016-02-25 MED ORDER — PROMETHAZINE HCL 25 MG/ML IJ SOLN: 6.2500 mg | INTRAMUSCULAR | Status: DC | PRN

## 2016-02-25 MED ORDER — CEFAZOLIN SODIUM-DEXTROSE 2-4 GM/100ML-% IV SOLN
2.0000 g | INTRAVENOUS | Status: AC
  Administered 2016-02-25: 2 g via INTRAVENOUS

## 2016-02-25 MED ORDER — CEFAZOLIN SODIUM-DEXTROSE 2-4 GM/100ML-% IV SOLN
INTRAVENOUS | Status: AC
  Filled 2016-02-25: qty 100

## 2016-02-25 MED ORDER — MIDAZOLAM HCL 2 MG/2ML IJ SOLN
INTRAMUSCULAR | Status: AC
  Filled 2016-02-25: qty 2

## 2016-02-25 MED ORDER — PROPOFOL 500 MG/50ML IV EMUL
INTRAVENOUS | Status: DC | PRN
  Administered 2016-02-25: 75 ug/kg/min via INTRAVENOUS

## 2016-02-25 MED ORDER — CHLORHEXIDINE GLUCONATE 4 % EX LIQD: 60.0000 mL | Freq: Once | CUTANEOUS | Status: DC

## 2016-02-25 MED ORDER — FENTANYL CITRATE (PF) 100 MCG/2ML IJ SOLN
50.0000 ug | INTRAMUSCULAR | Status: DC | PRN
  Administered 2016-02-25 (×2): 50 ug via INTRAVENOUS

## 2016-02-25 MED ORDER — SUCCINYLCHOLINE CHLORIDE 20 MG/ML IJ SOLN
INTRAMUSCULAR | Status: AC
  Filled 2016-02-25: qty 1

## 2016-02-25 MED ORDER — LACTATED RINGERS IV SOLN
INTRAVENOUS | Status: DC
  Administered 2016-02-25 (×2): via INTRAVENOUS

## 2016-02-25 MED ORDER — LIDOCAINE HCL (PF) 1 % IJ SOLN
INTRAMUSCULAR | Status: AC
  Filled 2016-02-25: qty 30

## 2016-02-25 MED ORDER — DEXAMETHASONE SODIUM PHOSPHATE 10 MG/ML IJ SOLN
INTRAMUSCULAR | Status: AC
  Filled 2016-02-25: qty 1

## 2016-02-25 MED ORDER — SCOPOLAMINE 1 MG/3DAYS TD PT72: 1.0000 | MEDICATED_PATCH | Freq: Once | TRANSDERMAL | Status: DC | PRN

## 2016-02-25 SURGICAL SUPPLY — 79 items
APL SKNCLS STERI-STRIP NONHPOA (GAUZE/BANDAGES/DRESSINGS) ×2
BAG DECANTER FOR FLEXI CONT (MISCELLANEOUS) IMPLANT
BANDAGE ACE 3X5.8 VEL STRL LF (GAUZE/BANDAGES/DRESSINGS) IMPLANT
BANDAGE ACE 4X5 VEL STRL LF (GAUZE/BANDAGES/DRESSINGS) ×4 IMPLANT
BENZOIN TINCTURE PRP APPL 2/3 (GAUZE/BANDAGES/DRESSINGS) ×4 IMPLANT
BIT DRILL 2 FAST STEP (BIT) ×4 IMPLANT
BIT DRILL 2.5X4 QC (BIT) ×4 IMPLANT
BLADE MINI RND TIP GREEN BEAV (BLADE) IMPLANT
BLADE SURG 15 STRL LF DISP TIS (BLADE) ×4 IMPLANT
BLADE SURG 15 STRL SS (BLADE) ×8
BNDG CMPR 9X4 STRL LF SNTH (GAUZE/BANDAGES/DRESSINGS)
BNDG ESMARK 4X9 LF (GAUZE/BANDAGES/DRESSINGS) IMPLANT
BNDG GAUZE ELAST 4 BULKY (GAUZE/BANDAGES/DRESSINGS) ×4 IMPLANT
CANISTER SUCT 1200ML W/VALVE (MISCELLANEOUS) IMPLANT
CLOSURE WOUND 1/2 X4 (GAUZE/BANDAGES/DRESSINGS) ×1
CORDS BIPOLAR (ELECTRODE) ×4 IMPLANT
COVER BACK TABLE 60X90IN (DRAPES) ×4 IMPLANT
CUFF TOURNIQUET SINGLE 18IN (TOURNIQUET CUFF) IMPLANT
DECANTER SPIKE VIAL GLASS SM (MISCELLANEOUS) IMPLANT
DRAPE EXTREMITY T 121X128X90 (DRAPE) ×4 IMPLANT
DRAPE OEC MINIVIEW 54X84 (DRAPES) ×4 IMPLANT
DRAPE SURG 17X23 STRL (DRAPES) ×4 IMPLANT
DURAPREP 26ML APPLICATOR (WOUND CARE) ×4 IMPLANT
ELECT REM PT RETURN 9FT ADLT (ELECTROSURGICAL)
ELECTRODE REM PT RTRN 9FT ADLT (ELECTROSURGICAL) IMPLANT
FAST PEG DRIVER ×8 IMPLANT
GAUZE SPONGE 4X4 12PLY STRL (GAUZE/BANDAGES/DRESSINGS) ×4 IMPLANT
GAUZE SPONGE 4X4 16PLY XRAY LF (GAUZE/BANDAGES/DRESSINGS) IMPLANT
GAUZE XEROFORM 1X8 LF (GAUZE/BANDAGES/DRESSINGS) IMPLANT
GLOVE BIO SURGEON STRL SZ8 (GLOVE) ×4 IMPLANT
GLOVE BIOGEL PI IND STRL 8.5 (GLOVE) ×2 IMPLANT
GLOVE BIOGEL PI INDICATOR 8.5 (GLOVE) ×2
GLOVE SURG SYN 8.0 (GLOVE) ×8 IMPLANT
GOWN STRL REUS W/ TWL LRG LVL3 (GOWN DISPOSABLE) ×2 IMPLANT
GOWN STRL REUS W/TWL LRG LVL3 (GOWN DISPOSABLE) ×4
GOWN STRL REUS W/TWL XL LVL3 (GOWN DISPOSABLE) ×8 IMPLANT
NEEDLE HYPO 25X1 1.5 SAFETY (NEEDLE) ×4 IMPLANT
NS IRRIG 1000ML POUR BTL (IV SOLUTION) ×4 IMPLANT
PACK BASIN DAY SURGERY FS (CUSTOM PROCEDURE TRAY) ×4 IMPLANT
PAD CAST 3X4 CTTN HI CHSV (CAST SUPPLIES) ×2 IMPLANT
PAD CAST 4YDX4 CTTN HI CHSV (CAST SUPPLIES) IMPLANT
PADDING CAST ABS 4INX4YD NS (CAST SUPPLIES) ×2
PADDING CAST ABS COTTON 4X4 ST (CAST SUPPLIES) ×2 IMPLANT
PADDING CAST COTTON 3X4 STRL (CAST SUPPLIES) ×4
PADDING CAST COTTON 4X4 STRL (CAST SUPPLIES)
PEG SUBCHONDRAL SMOOTH 2.0X20 (Peg) ×4 IMPLANT
PEG SUBCHONDRAL SMOOTH 2.0X22 (Peg) ×12 IMPLANT
PENCIL BUTTON HOLSTER BLD 10FT (ELECTRODE) IMPLANT
PLATE STAN 24.4X59.5 RT (Plate) ×4 IMPLANT
SCREW BN 12X3.5XNS CORT TI (Screw) ×4 IMPLANT
SCREW CORT 3.5X12 (Screw) ×8 IMPLANT
SCREW CORT 3.5X14 LNG (Screw) ×4 IMPLANT
SHEET MEDIUM DRAPE 40X70 STRL (DRAPES) ×4 IMPLANT
SLEEVE SCD COMPRESS KNEE MED (MISCELLANEOUS) ×4 IMPLANT
SPLINT PLASTER CAST XFAST 3X15 (CAST SUPPLIES) IMPLANT
SPLINT PLASTER CAST XFAST 4X15 (CAST SUPPLIES) ×10 IMPLANT
SPLINT PLASTER XTRA FAST SET 4 (CAST SUPPLIES) ×10
SPLINT PLASTER XTRA FASTSET 3X (CAST SUPPLIES)
STOCKINETTE 4X48 STRL (DRAPES) ×4 IMPLANT
STRIP CLOSURE SKIN 1/2X4 (GAUZE/BANDAGES/DRESSINGS) ×3 IMPLANT
SUCTION FRAZIER HANDLE 10FR (MISCELLANEOUS)
SUCTION TUBE FRAZIER 10FR DISP (MISCELLANEOUS) IMPLANT
SUT ETHILON 4 0 PS 2 18 (SUTURE) IMPLANT
SUT MERSILENE 4 0 P 3 (SUTURE) IMPLANT
SUT PROLENE 3 0 PS 2 (SUTURE) IMPLANT
SUT PROLENE 4 0 PS 2 18 (SUTURE) ×4 IMPLANT
SUT SILK 2 0 FS (SUTURE) IMPLANT
SUT VIC AB 0 SH 27 (SUTURE) ×4 IMPLANT
SUT VIC AB 2-0 PS2 27 (SUTURE) ×4 IMPLANT
SUT VIC AB 3-0 FS2 27 (SUTURE) IMPLANT
SUT VIC AB 4-0 RB1 18 (SUTURE) ×4 IMPLANT
SUT VICRYL RAPIDE 4-0 (SUTURE) IMPLANT
SUT VICRYL RAPIDE 4/0 PS 2 (SUTURE) IMPLANT
SYR BULB 3OZ (MISCELLANEOUS) ×4 IMPLANT
SYRINGE 10CC LL (SYRINGE) ×4 IMPLANT
TOWEL OR 17X24 6PK STRL BLUE (TOWEL DISPOSABLE) ×4 IMPLANT
TUBE CONNECTING 20'X1/4 (TUBING)
TUBE CONNECTING 20X1/4 (TUBING) IMPLANT
UNDERPAD 30X30 (UNDERPADS AND DIAPERS) ×4 IMPLANT

## 2016-02-25 NOTE — Op Note (Signed)
See note [phone]

## 2016-02-25 NOTE — Discharge Instructions (Signed)
°  Post Anesthesia Home Care Instructions ° °Activity: °Get plenty of rest for the remainder of the day. A responsible adult should stay with you for 24 hours following the procedure.  °For the next 24 hours, DO NOT: °-Drive a car °-Operate machinery °-Drink alcoholic beverages °-Take any medication unless instructed by your physician °-Make any legal decisions or sign important papers. ° °Meals: °Start with liquid foods such as gelatin or soup. Progress to regular foods as tolerated. Avoid greasy, spicy, heavy foods. If nausea and/or vomiting occur, drink only clear liquids until the nausea and/or vomiting subsides. Call your physician if vomiting continues. ° °Special Instructions/Symptoms: °Your throat may feel dry or sore from the anesthesia or the breathing tube placed in your throat during surgery. If this causes discomfort, gargle with warm salt water. The discomfort should disappear within 24 hours. ° °If you had a scopolamine patch placed behind your ear for the management of post- operative nausea and/or vomiting: ° °1. The medication in the patch is effective for 72 hours, after which it should be removed.  Wrap patch in a tissue and discard in the trash. Wash hands thoroughly with soap and water. °2. You may remove the patch earlier than 72 hours if you experience unpleasant side effects which may include dry mouth, dizziness or visual disturbances. °3. Avoid touching the patch. Wash your hands with soap and water after contact with the patch. °  °Regional Anesthesia Blocks ° °1. Numbness or the inability to move the "blocked" extremity may last from 3-48 hours after placement. The length of time depends on the medication injected and your individual response to the medication. If the numbness is not going away after 48 hours, call your surgeon. ° °2. The extremity that is blocked will need to be protected until the numbness is gone and the  Strength has returned. Because you cannot feel it, you will need  to take extra care to avoid injury. Because it may be weak, you may have difficulty moving it or using it. You may not know what position it is in without looking at it while the block is in effect. ° °3. For blocks in the legs and feet, returning to weight bearing and walking needs to be done carefully. You will need to wait until the numbness is entirely gone and the strength has returned. You should be able to move your leg and foot normally before you try and bear weight or walk. You will need someone to be with you when you first try to ensure you do not fall and possibly risk injury. ° °4. Bruising and tenderness at the needle site are common side effects and will resolve in a few days. ° °5. Persistent numbness or new problems with movement should be communicated to the surgeon or the [facility] ([phone])/ [facility] ([phone]). °

## 2016-02-25 NOTE — Transfer of Care (Signed)
Immediate Anesthesia Transfer of Care Note  Patient: Charles BreathRobert Barron  Procedure(s) Performed: Procedure(s) with comments: OPEN REDUCTION INTERNAL FIXATION (ORIF) DISTAL RADIAL FRACTURE (Right) - orif right distal radius fx with right carpal tunnel rel. CARPAL TUNNEL RELEASE (Right)  Patient Location: PACU  Anesthesia Type:MAC combined with regional for post-op pain  Level of Consciousness: awake, sedated and patient cooperative  Airway & Oxygen Therapy: Patient Spontanous Breathing and Patient connected to face mask oxygen  Post-op Assessment: Report given to RN and Post -op Vital signs reviewed and stable  Post vital signs: Reviewed and stable  Last Vitals:  Filed Vitals:   02/25/16 1031 02/25/16 1032  BP:    Pulse: 80 92  Temp:    Resp: 15 14    Complications: No apparent anesthesia complications

## 2016-02-25 NOTE — Progress Notes (Signed)
Assisted Dr. Turk with right, ultrasound guided, supraclavicular block. Side rails up, monitors on throughout procedure. See vital signs in flow sheet. Tolerated Procedure well. 

## 2016-02-25 NOTE — H&P (Signed)
Charles Barron is an 25 y.o. male.   Chief Complaint: right wrist pain HPI: as above s/p FOOSH this past Sunday with xr positive for radius fracture on right  Past Medical History  Diagnosis Date  . GERD (gastroesophageal reflux disease)     History reviewed. No pertinent past surgical history.  History reviewed. No pertinent family history. Social History:  reports that he has been smoking Cigarettes.  He has been smoking about 0.50 packs per day. He does not have any smokeless tobacco history on file. He reports that he drinks alcohol. He reports that he uses illicit drugs (Marijuana).  Allergies: No Known Allergies  Medications Prior to Admission  Medication Sig Dispense Refill  . oxyCODONE-acetaminophen (PERCOCET/ROXICET) 5-325 MG tablet Take 1-2 tablets by mouth every 6 (six) hours as needed for severe pain. 8 tablet 0  . ondansetron (ZOFRAN ODT) 4 MG disintegrating tablet Take 1 tablet (4 mg total) by mouth every 8 (eight) hours as needed for nausea or vomiting. 20 tablet 0    No results found for this or any previous visit (from the past 48 hour(s)). Dg Forearm Right  02/23/2016  CLINICAL DATA:  Larey SeatFell yesterday skating about 45 feet onto RIGHT forearm and wrist, distal forearm pain on thumb side with bruising. EXAM: RIGHT FOREARM - 2 VIEW COMPARISON:  None. FINDINGS: Osseous mineralization normal. Elbow joint alignment normal. Displaced ulnar styloid fracture. Transverse metaphyseal fracture distal RIGHT radius with volar displacement of fragment and intra-articular extension into radiocarpal joint and questionably into the distal radioulnar joint as well. Shafts of radius and ulna appear intact. No elbow joint effusion. Distal forearm soft tissue swelling extending to wrist. IMPRESSION: Displaced ulnar styloid fracture. Displaced distal RIGHT radial metaphyseal fracture with intra-articular extension into radiocarpal joint and probably distal radioulnar joint as well Electronically Signed    By: Ulyses SouthwardMark  Boles M.D.   On: 02/23/2016 15:30   Dg Wrist Complete Right  02/23/2016  CLINICAL DATA:  Fall while skating yesterday. Distal forearm and lateral pain. Bruising. EXAM: RIGHT WRIST - COMPLETE 3+ VIEW COMPARISON:  None. FINDINGS: The comminuted distal radius fracture demonstrates 3-4 mm of lateral displacement. There slight volar angulation. The fracture extends to the articular surface. An ulnar styloid fracture is also noted. The carpal bones are intact. Extensive soft swelling is present. IMPRESSION: 1. Comminuted distal radius fracture with 3- new 4 mm of lateral displacement. 2. Probable intra-arterial extension of the fracture. 3. Mildly displaced ulnar styloid fracture. Electronically Signed   By: Marin Robertshristopher  Mattern M.D.   On: 02/23/2016 15:29    Review of Systems  All other systems reviewed and are negative.   Blood pressure 120/75, pulse 88, temperature 98.1 F (36.7 C), temperature source Oral, resp. rate 20, height 5\' 4"  (1.626 m), weight 63.05 kg (139 lb), SpO2 98 %. Physical Exam  Constitutional: He is oriented to person, place, and time. He appears well-developed and well-nourished.  HENT:  Head: Normocephalic and atraumatic.  Cardiovascular: Normal rate.   Respiratory: Effort normal.  Musculoskeletal:       Right wrist: He exhibits tenderness, bony tenderness, swelling and deformity.  Volarly displaced right distal radius fracture with intermittent median nerve symptoms  Neurological: He is alert and oriented to person, place, and time.  Skin: Skin is warm.  Psychiatric: He has a normal mood and affect. His behavior is normal. Judgment and thought content normal.     Assessment/Plan As above  Plan ORIF with CTR  Dairl PonderWEINGOLD,Yashvi Jasinski A, MD 02/25/2016, 9:58 AM

## 2016-02-25 NOTE — Anesthesia Procedure Notes (Addendum)
Anesthesia Regional Block:  Supraclavicular block  Pre-Anesthetic Checklist: ,, timeout performed, Correct Patient, Correct Site, Correct Laterality, Correct Procedure, Correct Position, site marked, Risks and benefits discussed,  Surgical consent,  Pre-op evaluation,  At surgeon's request and post-op pain management  Laterality: Right  Prep: chloraprep       Needles:  Injection technique: Single-shot  Needle Type: Echogenic Stimulator Needle     Needle Length: 10cm 10 cm Needle Gauge: 22 and 22 G    Additional Needles:  Procedures: ultrasound guided (picture in chart) Supraclavicular block Narrative:  Injection made incrementally with aspirations every 5 mL.  Performed by: Personally  Anesthesiologist: Cecile HearingURK, Rishon Thilges EDWARD  Additional Notes: Functioning IV was confirmed and monitors were applied.  A 100mm 22ga echogenic stimulator needle was used. Sterile prep and drape,hand hygiene and sterile gloves were used.  Negative aspiration and negative test dose prior to incremental administration of local anesthetic. The patient tolerated the procedure well.  Ultrasound guidance: relevent anatomy identified, needle position confirmed, local anesthetic spread visualized around nerve(s), vascular puncture avoided.  Image printed for medical record.

## 2016-02-25 NOTE — Anesthesia Preprocedure Evaluation (Addendum)
Anesthesia Evaluation  Patient identified by MRN, date of birth, ID band Patient awake    Reviewed: Allergy & Precautions, NPO status , Patient's Chart, lab work & pertinent test results  Airway Mallampati: II  TM Distance: >3 FB Neck ROM: Full    Dental  (+) Teeth Intact, Dental Advisory Given,    Pulmonary Current Smoker,    Pulmonary exam normal breath sounds clear to auscultation       Cardiovascular Exercise Tolerance: Good negative cardio ROS Normal cardiovascular exam Rhythm:Regular Rate:Normal     Neuro/Psych  Neuromuscular disease (right hand intermittent numbness) negative psych ROS   GI/Hepatic Neg liver ROS,   Endo/Other  negative endocrine ROS  Renal/GU negative Renal ROS     Musculoskeletal negative musculoskeletal ROS (+)   Abdominal   Peds  Hematology negative hematology ROS (+)   Anesthesia Other Findings Day of surgery medications reviewed with the patient.  Reproductive/Obstetrics                            Anesthesia Physical Anesthesia Plan  ASA: II  Anesthesia Plan: Regional and MAC   Post-op Pain Management:    Induction: Intravenous  Airway Management Planned: Nasal Cannula  Additional Equipment:   Intra-op Plan:   Post-operative Plan:   Informed Consent: I have reviewed the patients History and Physical, chart, labs and discussed the procedure including the risks, benefits and alternatives for the proposed anesthesia with the patient or authorized representative who has indicated his/her understanding and acceptance.   Dental advisory given  Plan Discussed with: CRNA, Surgeon and Anesthesiologist  Anesthesia Plan Comments: (Risks/benefits of regional block discussed with patient including risk of bleeding, infection, nerve damage, and possibility of failed block.  Also discussed backup plan of general anesthesia and associated risks.  Patient wishes  to proceed.)       Anesthesia Quick Evaluation

## 2016-02-25 NOTE — Anesthesia Postprocedure Evaluation (Signed)
Anesthesia Post Note  Patient: Charles Barron  Procedure(s) Performed: Procedure(s) (LRB): OPEN REDUCTION INTERNAL FIXATION (ORIF) DISTAL RADIAL FRACTURE (Right) CARPAL TUNNEL RELEASE (Right)  Patient location during evaluation: PACU Anesthesia Type: MAC and Regional Level of consciousness: awake and alert, awake and oriented Pain management: pain level controlled Vital Signs Assessment: post-procedure vital signs reviewed and stable Respiratory status: spontaneous breathing, nonlabored ventilation, respiratory function stable and patient connected to nasal cannula oxygen Cardiovascular status: stable and blood pressure returned to baseline Anesthetic complications: no Comments: Supraclavicular nerve block plus MAC for case. Patient with no pain post-operatively.    Last Vitals:  Filed Vitals:   02/25/16 1239 02/25/16 1255  BP:  116/84  Pulse: 82 74  Temp:  36.4 C  Resp: 10 16    Last Pain:  Filed Vitals:   02/25/16 1256  PainSc: 0-No pain                 Charles Barron

## 2016-02-26 ENCOUNTER — Encounter (HOSPITAL_BASED_OUTPATIENT_CLINIC_OR_DEPARTMENT_OTHER): Payer: Self-pay | Admitting: Orthopedic Surgery

## 2016-02-26 NOTE — Op Note (Signed)
NAME:  Charles Barron, Charles Barron                      ACCOUNT NO.:  MEDICAL RECORD NO.:  [id number]  LOCATION:                                 FACILITY:  PHYSICIAN:  Artist Pais. Ilene Witcher, M.D.DATE OF BIRTH:  05-29-1991  DATE OF PROCEDURE:  02/25/2016 DATE OF DISCHARGE:                              OPERATIVE REPORT   PREOPERATIVE DIAGNOSIS:  Displaced intra-articular fracture, four part, right distal radius with carpal tunnel syndrome.  POSTOPERATIVE DIAGNOSIS:  Displaced intra-articular fracture, four part, right distal radius with carpal tunnel syndrome.  PROCEDURE:  Open reduction and internal fixation, right distal radius fracture with DVR plate and screws and carpal tunnel release through the same incision, release of the brachioradialis tendon.  SURGEON:  Artist Pais. Mina Marble, M.D.  ASSISTANT:  None.  ANESTHESIA:  Ax block and sedation.  COMPLICATIONS:  None.  DRAINS:  None.  DESCRIPTION OF PROCEDURE:  The patient was taken to the operating suite, and after the induction of adequate axillary block analgesia and then IV sedation, the right upper extremity was prepped and draped in the usual sterile fashion.  An Esmarch was used to exsanguinate the limb.  The tourniquet was inflated to 250 mmHg.  At this point in time, an incision was made over the palpable border of the flexor carpi radialis tendon. Skin was incised sharply.  Dissection was carried down the sheath overlying the FCR, this was incised.  The FCR was retracted to the midline, the radial artery to the lateral side.  Dissection was carried down to the level of the pronator quadratus.  This was subperiosteally stripped off the distal radius revealing the fracture site.  We used a Therapist, nutritional to expose the fracture including release of the brachioradialis off the radial styloid fragment.  There was a complex four-part closed displaced distal radius fracture.  We used a rolled up towel and hyperextended the wrist over  the patella to aid in reduction. We then placed a standard right DVR plate on the lower aspect of the distal radius.  We put a screw in the slotted hole and under direct and fluoroscopic guidance to determine appropriate plate and position __________ volar fragments.  Once this was completed, 2 more cortical screws were placed proximally, followed by the smooth pegs distally x4. Intraoperative fluoroscopy revealed adequate reduction in AP, lateral and oblique view.  We then identified the median nerve in the proximal aspect of the wound, dissected down to the level of the proximal edge of the transverse carpal ligament.  We identified and retracted the palmar cutaneous branch of the median nerve.  We then used a Therapist, nutritional to create a path dorsal and volar to the transverse carpal ligament.  The Glorious Peach was then used to protect the nerve and we divided the transverse carpal ligament from proximal to distal freeing up the nerve.  The wound was irrigated.  We then loosely closed the pronator quadratus over the plate with 2-0 undyed Vicryl.  The skin was closed subcutaneously with a 4-0 Vicryl x3, and a 3-0 Prolene subcuticular stitch was used to close the skin.  Steri-Strips, 4x4, fluffs, and a volar splint were applied. The  patient tolerated the procedure well and went to recovery room in stable fashion.     Artist PaisMatthew A. Mina MarbleWeingold, M.D.     MAW/MEDQ  D:  02/25/2016  T:  02/26/2016  Job:  [id number]

## 2016-08-05 ENCOUNTER — Ambulatory Visit: Payer: Self-pay

## 2017-04-18 IMAGING — DX DG WRIST COMPLETE 3+V*R*
4 series · 4 of 4 positions shown · non-contrast
Comparison: None.

CLINICAL DATA: Fall while skating yesterday. Distal forearm and
lateral pain. Bruising.

EXAM:
RIGHT WRIST - COMPLETE 3+ VIEW

[wrist pa]
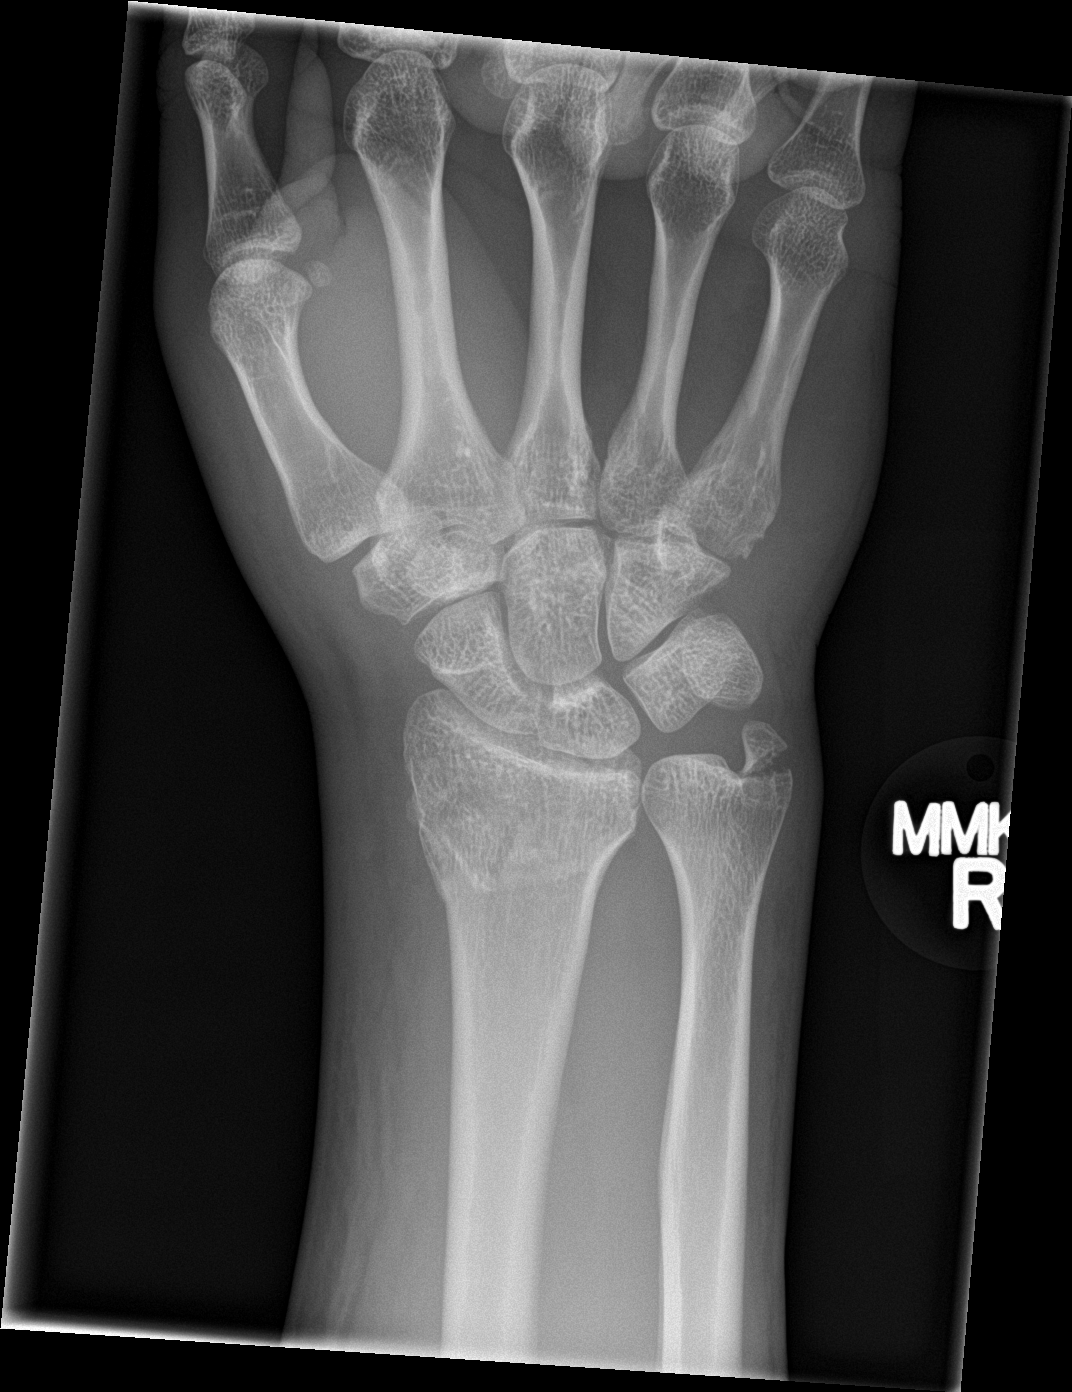

[wrist obl]
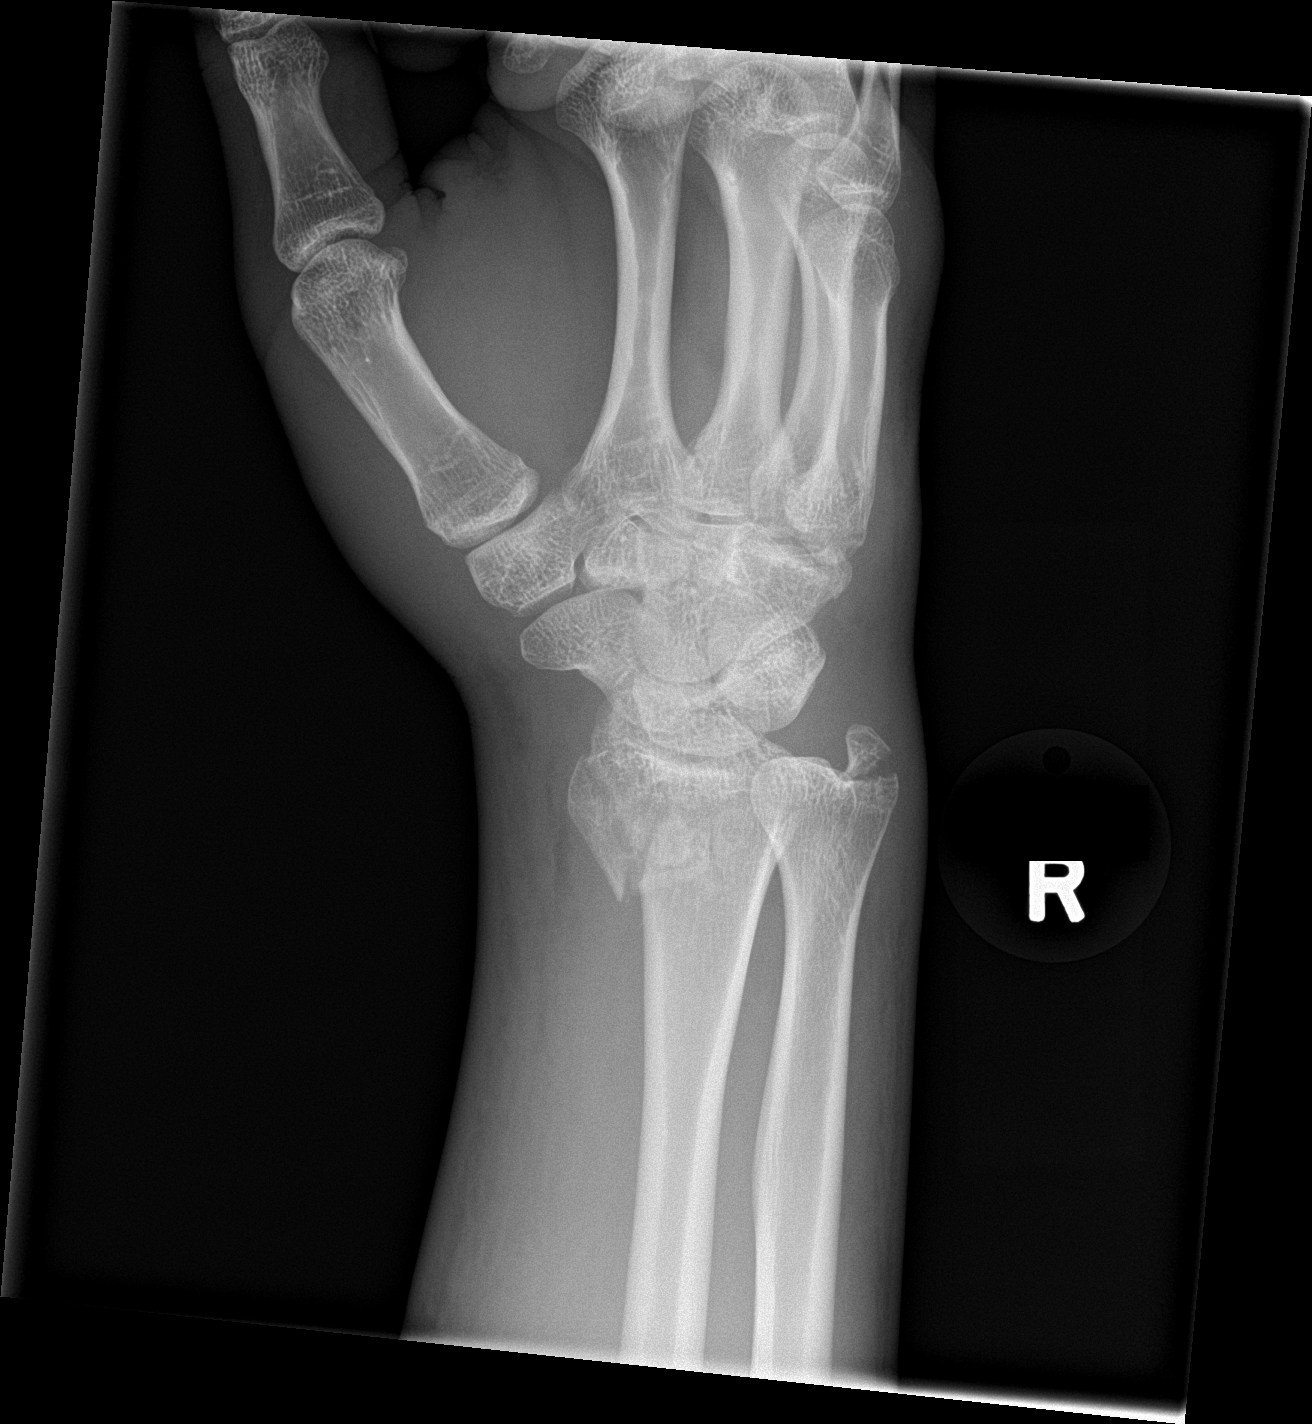

[wrist lat]
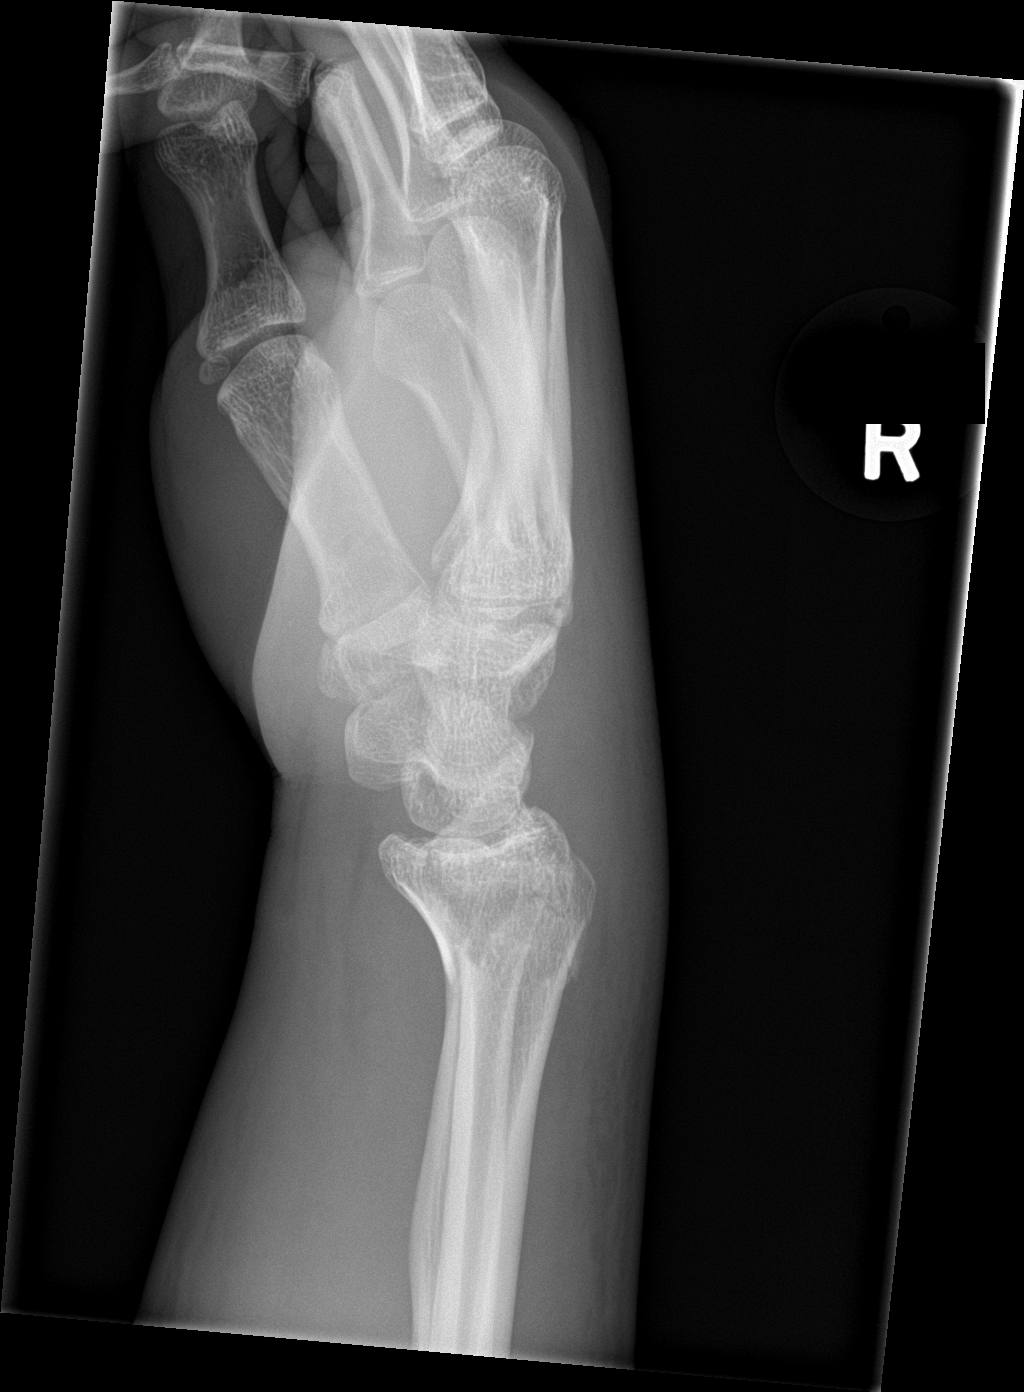

[wrist navicular]
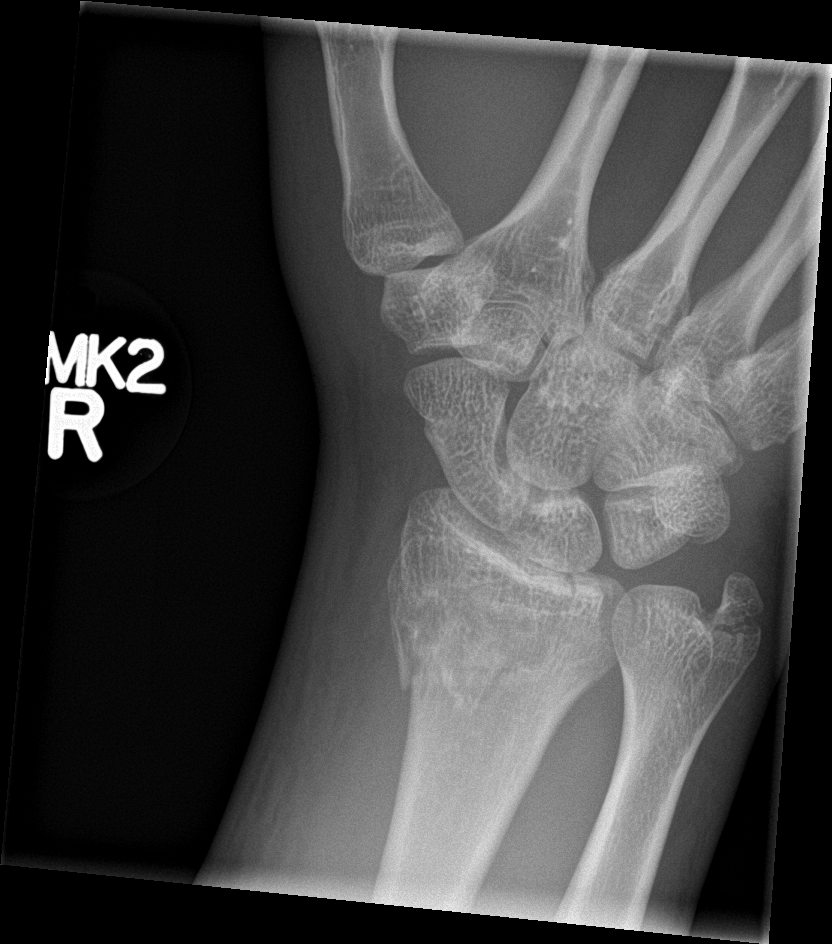

[4 of 4 positions shown; findings below may reference images not displayed]

FINDINGS: The comminuted distal radius fracture demonstrates 3-4 mm of lateral
displacement. There slight volar angulation. The fracture extends to
the articular surface. An ulnar styloid fracture is also noted. The
carpal bones are intact. Extensive soft swelling is present.
IMPRESSION: 1. Comminuted distal radius fracture with 3- new 4 mm of lateral
displacement.
2. Probable intra-arterial extension of the fracture.
3. Mildly displaced ulnar styloid fracture.

## 2020-03-26 ENCOUNTER — Emergency Department (HOSPITAL_COMMUNITY)
Admission: EM | Admit: 2020-03-26 | Discharge: 2020-03-26 | Disposition: A | Discharge status: Home / Self Care | Payer: Self-pay | Attending: Emergency Medicine | Admitting: Emergency Medicine

## 2020-03-26 ENCOUNTER — Emergency Department (HOSPITAL_COMMUNITY): Payer: Self-pay

## 2020-03-26 ENCOUNTER — Other Ambulatory Visit: Payer: Self-pay

## 2020-03-26 DIAGNOSIS — N23 Unspecified renal colic: Secondary | ICD-10-CM | POA: Insufficient documentation

## 2020-03-26 DIAGNOSIS — F17210 Nicotine dependence, cigarettes, uncomplicated: Secondary | ICD-10-CM | POA: Insufficient documentation

## 2020-03-26 LAB — CBC
HCT: 42.9 % (ref 39.0–52.0)
Hemoglobin: 14.9 g/dL (ref 13.0–17.0)
MCH: 32.3 pg (ref 26.0–34.0)
MCHC: 34.7 g/dL (ref 30.0–36.0)
MCV: 92.9 fL (ref 80.0–100.0)
Platelets: 185 10*3/uL (ref 150–400)
RBC: 4.62 MIL/uL (ref 4.22–5.81)
RDW: 12.4 % (ref 11.5–15.5)
WBC: 4.7 10*3/uL (ref 4.0–10.5)
nRBC: 0 % (ref 0.0–0.2)

## 2020-03-26 LAB — URINALYSIS, ROUTINE W REFLEX MICROSCOPIC
Bacteria, UA: NONE SEEN
Bilirubin Urine: NEGATIVE
Glucose, UA: NEGATIVE mg/dL
Ketones, ur: NEGATIVE mg/dL
Leukocytes,Ua: NEGATIVE
Nitrite: NEGATIVE
Protein, ur: NEGATIVE mg/dL
RBC / HPF: >50 RBC/hpf — ABNORMAL HIGH (ref 0–5)
Specific Gravity, Urine: 1.015 (ref 1.005–1.030)
pH: 6 (ref 5.0–8.0)

## 2020-03-26 LAB — BASIC METABOLIC PANEL
Anion gap: 15 (ref 5–15)
BUN: 14 mg/dL (ref 6–20)
CO2: 19 mmol/L — ABNORMAL LOW (ref 22–32)
Calcium: 9.5 mg/dL (ref 8.9–10.3)
Chloride: 105 mmol/L (ref 98–111)
Creatinine, Ser: 0.97 mg/dL (ref 0.61–1.24)
GFR calc Af Amer: >60 mL/min (ref >60)
GFR calc non Af Amer: >60 mL/min (ref >60)
Glucose, Bld: 106 mg/dL — ABNORMAL HIGH (ref 70–99)
Potassium: 3.9 mmol/L (ref 3.5–5.1)
Sodium: 139 mmol/L (ref 135–145)

## 2020-03-26 MED ORDER — HYDROMORPHONE HCL 1 MG/ML IJ SOLN
1.0000 mg | Freq: Once | INTRAMUSCULAR | Status: AC
  Administered 2020-03-26: 1 mg via INTRAVENOUS
  Filled 2020-03-26: qty 1

## 2020-03-26 MED ORDER — KETOROLAC TROMETHAMINE 10 MG PO TABS
10.0000 mg | ORAL_TABLET | Freq: Once | ORAL | Status: AC
  Administered 2020-03-26: 10 mg via ORAL
  Filled 2020-03-26: qty 1

## 2020-03-26 MED ORDER — ONDANSETRON HCL 4 MG PO TABS: 4.0000 mg | ORAL_TABLET | Freq: Three times a day (TID) | ORAL | 0 refills | Status: AC | PRN

## 2020-03-26 MED ORDER — KETOROLAC TROMETHAMINE 10 MG PO TABS: 10.0000 mg | ORAL_TABLET | Freq: Four times a day (QID) | ORAL | 0 refills | Status: AC | PRN

## 2020-03-26 MED ORDER — METOCLOPRAMIDE HCL 5 MG/ML IJ SOLN
10.0000 mg | Freq: Once | INTRAMUSCULAR | Status: AC
  Administered 2020-03-26: 10 mg via INTRAVENOUS
  Filled 2020-03-26: qty 2

## 2020-03-26 MED ORDER — TAMSULOSIN HCL 0.4 MG PO CAPS: 0.4000 mg | ORAL_CAPSULE | Freq: Two times a day (BID) | ORAL | 0 refills | Status: AC

## 2020-03-26 MED ORDER — KETOROLAC TROMETHAMINE 30 MG/ML IJ SOLN
30.0000 mg | Freq: Once | INTRAMUSCULAR | Status: AC
  Administered 2020-03-26: 30 mg via INTRAVENOUS
  Filled 2020-03-26: qty 1

## 2020-03-26 MED ORDER — SODIUM CHLORIDE 0.9 % IV SOLN
1.5000 mg/kg | Freq: Once | INTRAVENOUS | Status: AC
  Administered 2020-03-26: 96 mg via INTRAVENOUS
  Filled 2020-03-26: qty 4.8

## 2020-03-26 MED ORDER — TAMSULOSIN HCL 0.4 MG PO CAPS
0.8000 mg | ORAL_CAPSULE | Freq: Once | ORAL | Status: AC
  Administered 2020-03-26: 0.8 mg via ORAL
  Filled 2020-03-26: qty 2

## 2020-03-26 MED ORDER — OXYCODONE-ACETAMINOPHEN 5-325 MG PO TABS: 1.0000 | ORAL_TABLET | Freq: Four times a day (QID) | ORAL | 0 refills | Status: AC | PRN

## 2020-03-26 NOTE — ED Notes (Signed)
Pharmacy contacted in order to check status of xylocaine IVPB. Pharm states they will prepare medication and send ASAP.

## 2020-03-26 NOTE — Discharge Instructions (Signed)
Return to the ED immediately if you develop fever, uncontrolled pain or vomiting, or other concerns. ° °

## 2020-03-26 NOTE — ED Notes (Signed)
Pt transported to CT ?

## 2020-03-26 NOTE — ED Triage Notes (Signed)
Pt arrives pov with L sided flank pain onset yesterday. States the pain suddenly worsened today. Pt pacing in triage, appears uncomfortable.

## 2020-03-26 NOTE — ED Provider Notes (Signed)
[facility] EMERGENCY DEPARTMENT Provider Note   CSN: [id number] Arrival date & time: 03/26/20  6222     History No chief complaint on file.   [facility] is a 29 y.o. male.  The history is provided by the patient, the spouse and medical records.  Flank Pain This is a recurrent problem. The current episode started yesterday. The problem occurs constantly. The problem has been rapidly worsening. Pertinent negatives include no chest pain, no abdominal pain, no headaches and no shortness of breath. Nothing relieves the symptoms. He has tried nothing for the symptoms.  29 year old male who has a history of kidney stones several years ago who presents emergency department with chief complaint of left flank pain.  He has had intermittent achy low back and left flank pain for the past few days and has been complaining of symptoms of "UTI" with frequency, urgency and burning with urination.  This morning his pain became acute, severe, 10 out of 10 left flank pain radiating towards the groin.  He has had severe nausea but has not had any vomiting.  He cannot find a position of comfort.  This feels the same as his previous kidney stone.  He denies fevers, chills, body aches.    Past Medical History:  Diagnosis Date  . GERD (gastroesophageal reflux disease)     There are no problems to display for this patient.   Past Surgical History:  Procedure Laterality Date  . CARPAL TUNNEL RELEASE Right 02/25/2016   Procedure: CARPAL TUNNEL RELEASE;  Surgeon: Dairl Ponder, MD;  Location: [facility];  Service: Orthopedics;  Laterality: Right;  . OPEN REDUCTION INTERNAL FIXATION (ORIF) DISTAL RADIAL FRACTURE Right 02/25/2016   Procedure: OPEN REDUCTION INTERNAL FIXATION (ORIF) DISTAL RADIAL FRACTURE;  Surgeon: Dairl Ponder, MD;  Location: [facility];  Service: Orthopedics;  Laterality: Right;  orif right distal radius fx with right carpal tunnel rel.        No family history on file.  Social History   Tobacco Use  . Smoking status: Current Every Day Smoker    Packs/day: 0.50    Types: Cigarettes  Substance Use Topics  . Alcohol use: Yes  . Drug use: Yes    Types: Marijuana    Home Medications Prior to Admission medications   Medication Sig Start Date End Date Taking? Authorizing Provider  ketorolac (TORADOL) 10 MG tablet Take 1 tablet (10 mg total) by mouth every 6 (six) hours as needed. 03/26/20   Unika Nazareno, PA-C  ondansetron (ZOFRAN) 4 MG tablet Take 1 tablet (4 mg total) by mouth every 8 (eight) hours as needed for nausea or vomiting. 03/26/20   Arthor Captain, PA-C  oxyCODONE-acetaminophen (PERCOCET) 5-325 MG tablet Take 1-2 tablets by mouth every 6 (six) hours as needed for severe pain. Kidney stone 03/26/20   Arthor Captain, PA-C  tamsulosin (FLOMAX) 0.4 MG CAPS capsule Take 1 capsule (0.4 mg total) by mouth 2 (two) times daily. 03/26/20   Arthor Captain, PA-C    Allergies    Patient has no known allergies.  Review of Systems   Review of Systems  Constitutional: Negative for chills and fever.  HENT: Negative.   Eyes: Negative.   Respiratory: Negative for shortness of breath.   Cardiovascular: Negative for chest pain.  Gastrointestinal: Negative for abdominal pain.  Genitourinary: Positive for dysuria, flank pain, frequency and urgency.  Musculoskeletal: Negative for arthralgias, gait problem and joint swelling.  Skin: Negative.   Neurological: Negative.  Negative for headaches.    Physical Exam Updated Vital Signs BP (!) 130/93   Pulse 100   Temp 97.6 F (36.4 C) (Oral)   Resp (!) 23   Ht 5\' 4"  (1.626 m)   Wt 63.5 kg   SpO2 98%   BMI 24.03 kg/m   Physical Exam Vitals and nursing note reviewed.  Constitutional:      General: He is not in acute distress.    Appearance: He is well-developed. He is ill-appearing. He is not diaphoretic.     Comments: Patient sitting with his knees on the ground in  his head on the examining gurney.  He appears extremely uncomfortable.  Pale.  He appears distracted by his pain.  HENT:     Head: Normocephalic and atraumatic.  Eyes:     General: No scleral icterus.    Conjunctiva/sclera: Conjunctivae normal.  Cardiovascular:     Rate and Rhythm: Normal rate and regular rhythm.     Heart sounds: Normal heart sounds.  Pulmonary:     Effort: Pulmonary effort is normal. No respiratory distress.     Breath sounds: Normal breath sounds.  Abdominal:     Palpations: Abdomen is soft.     Tenderness: There is no abdominal tenderness. There is left CVA tenderness.  Musculoskeletal:     Cervical back: Normal range of motion and neck supple.  Skin:    General: Skin is warm and dry.  Neurological:     Mental Status: He is alert.  Psychiatric:        Behavior: Behavior normal.     ED Results / Procedures / Treatments   Labs (all labs ordered are listed, but only abnormal results are displayed) Labs Reviewed  BASIC METABOLIC PANEL - Abnormal; Notable for the following components:      Result Value   CO2 19 (*)    Glucose, Bld 106 (*)    All other components within normal limits  URINALYSIS, ROUTINE W REFLEX MICROSCOPIC - Abnormal; Notable for the following components:   Hgb urine dipstick MODERATE (*)    RBC / HPF >50 (*)    All other components within normal limits  CBC    EKG None  Radiology CT RENAL STONE STUDY  Result Date: 03/26/2020 CLINICAL DATA:  Left flank pain since yesterday. History of renal calculi. EXAM: CT ABDOMEN AND PELVIS WITHOUT CONTRAST TECHNIQUE: Multidetector CT imaging of the abdomen and pelvis was performed following the standard protocol without IV contrast. COMPARISON:  CT scan 04/14/2015 FINDINGS: Lower chest: Insert lung bases Hepatobiliary: No hepatic lesions or intrahepatic biliary dilatation. The gallbladder is normal. No common bile duct dilatation. Pancreas: No mass, inflammation or ductal dilatation. Spleen: Within  normal limits in size. No focal lesions. Adrenals/Urinary Tract: The adrenal glands are normal. Bilateral renal calculi are noted. There is mild to moderate left-sided hydroureteronephrosis down to an obstructing 4.5 mm calculus at the left UVJ. A tiny adjacent stone is noted slightly more proximally. No bladder calculi. No worrisome renal or bladder lesions are identified without contrast. Stomach/Bowel: The stomach, duodenum, small bowel and colon are grossly normal without oral contrast. No inflammatory changes, mass lesions or obstructive findings. The appendix is normal. Vascular/Lymphatic: The aorta is normal in caliber. No atheroscerlotic calcifications. No mesenteric of retroperitoneal mass or adenopathy. Small scattered lymph nodes are noted. Reproductive: The prostate gland and seminal vesicles are unremarkable. Other: No pelvic mass or adenopathy. No free pelvic fluid collections. No inguinal mass or adenopathy. No abdominal wall  hernia or subcutaneous lesions. Musculoskeletal: No significant bony findings IMPRESSION: 1. 4.5 mm left UVJ calculus causing mild to moderate left-sided hydroureteronephrosis. A tiny adjacent stone is noted slightly more proximally. 2. Bilateral renal calculi. 3. No worrisome renal or bladder lesions without contrast. Electronically Signed   By: Rudie Meyer M.D.   On: 03/26/2020 10:59    Procedures Procedures (including critical care time)  Medications Ordered in ED Medications  HYDROmorphone (DILAUDID) injection 1 mg (1 mg Intravenous Given 03/26/20 0814)  metoCLOPramide (REGLAN) injection 10 mg (10 mg Intravenous Given 03/26/20 0814)  lidocaine (XYLOCAINE) 96 mg in sodium chloride 0.9 % 100 mL IVPB (0 mg/kg  63.5 kg Intravenous Stopped 03/26/20 1012)  ketorolac (TORADOL) 30 MG/ML injection 30 mg (30 mg Intravenous Given 03/26/20 0829)  HYDROmorphone (DILAUDID) injection 1 mg (1 mg Intravenous Given 03/26/20 1016)  tamsulosin (FLOMAX) capsule 0.8 mg (0.8 mg Oral Given  03/26/20 1210)  HYDROmorphone (DILAUDID) injection 1 mg (1 mg Intravenous Given 03/26/20 1257)  ketorolac (TORADOL) tablet 10 mg (10 mg Oral Given 03/26/20 1323)    ED Course  I have reviewed the triage vital signs and the nursing notes.  Pertinent labs & imaging results that were available during my care of the patient were reviewed by me and considered in my medical decision making (see chart for details).    MDM Rules/Calculators/A&P                     4:39 PM BP (!) 130/93   Pulse 100   Temp 97.6 F (36.4 C) (Oral)   Resp (!) 23   Ht 5\' 4"  (1.626 m)   Wt 63.5 kg   SpO2 98%   BMI 24.03 kg/m  Patient here with severe left flank pain.  High suspicion for recurrent kidney stone. The differential diagnosis of emergent flank pain includes, but is not limited to :Abdominal aortic aneurysm,, Renal artery embolism,Renal vein thrombosis, Aortic dissection, Mesenteric ischemia, Pyelonephritis, Renal infarction, Renal hemorrhage, Nephrolithiasis/ Renal Colic, Bladder tumor,Cystitis, Biliary colic, Pancreatitis Perforated peptic ulcer Appendicitis ,Inguinal Hernia, Diverticulitis, Bowel obstruction Testicular torsion,Epididymitis Shingles Lower lobe pneumonia, Retroperitoneal hematoma/abscess/tumor, Epidural abscess, Epidural hematoma Medications  HYDROmorphone (DILAUDID) injection 1 mg (1 mg Intravenous Given 03/26/20 0814)  metoCLOPramide (REGLAN) injection 10 mg (10 mg Intravenous Given 03/26/20 0814)  lidocaine (XYLOCAINE) 96 mg in sodium chloride 0.9 % 100 mL IVPB (0 mg/kg  63.5 kg Intravenous Stopped 03/26/20 1012)  ketorolac (TORADOL) 30 MG/ML injection 30 mg (30 mg Intravenous Given 03/26/20 0829)  HYDROmorphone (DILAUDID) injection 1 mg (1 mg Intravenous Given 03/26/20 1016)  tamsulosin (FLOMAX) capsule 0.8 mg (0.8 mg Oral Given 03/26/20 1210)  HYDROmorphone (DILAUDID) injection 1 mg (1 mg Intravenous Given 03/26/20 1257)  ketorolac (TORADOL) tablet 10 mg (10 mg Oral Given 03/26/20 1323)     Final Clinical Impression(s) / ED Diagnoses Final diagnoses:  Ureteral colic   CC: Left flank pain VS: BP (!) 130/93   Pulse 100   Temp 97.6 F (36.4 C) (Oral)   Resp (!) 23   Ht 5\' 4"  (1.626 m)   Wt 63.5 kg   SpO2 98%   BMI 24.03 kg/m   05/26/20 is gathered by the patient and wife at bedside. Previous records obtained and reviewed. DDX:The patient's complaint of Flank pain involves an extensive number of diagnostic and treatment options, and is a complaint that carries with it a high risk of complications, morbidity, and potential mortality. Given the large differential diagnosis, medical decision making  is of high complexity. The differential diagnosis of emergent flank pain includes, but is not limited to :Abdominal aortic aneurysm,, Renal artery embolism,Renal vein thrombosis, Aortic dissection, Mesenteric ischemia, Pyelonephritis, Renal infarction, Renal hemorrhage, Nephrolithiasis/ Renal Colic, Bladder tumor,Cystitis, Biliary colic, Pancreatitis Perforated peptic ulcer Appendicitis ,Inguinal Hernia, Diverticulitis, Bowel obstruction Testicular torsion,Epididymitis Shingles Lower lobe pneumonia, Retroperitoneal hematoma/abscess/tumor, Epidural abscess, Epidural hematoma Labs: I ordered reviewed and interpreted labs which includeCBC which shows no abnormality.  BMP shows mildly elevated blood glucose is slightly low bicarb.  Blood glucose likely elevated due to acute phase reaction.  UA shows high red blood cell count.Marland Kitchen  UA without infection. Imaging: I ordered and reviewed images which included CT renal stone study.  I independently visualized and interpreted all imaging. Significant findings include bilateral kidney stones, a left 4 mm UVJ stone with hydroureteronephrosis with a secondary small stone just behind it..  EKG: Consults: none MDM: Patient here with left flank pain.  He has a 4 mm obstructing UVJ stone on the left.  No evidence of infection.  No obstructive  uropathy.  Patient was given multiple pain medications here in the emergency department with significant improvement in his pain.  He did have 1 episode of relapse but then that was transient and resolved.  I have warned the patient that he may have some episodes and that if his pain is persistent and intolerable if he is vomiting uncontrollably or if he is unable to urinate he should return to the emergency emergency department immediately.  He is doing much better and appears otherwise appropriate for discharge at this time.  Return precautions discussed. Patient disposition: Discharge The patient appears reasonably screened and/or stabilized for discharge and I doubt any other medical condition or other [facility] requiring further screening, evaluation, or treatment in the ED at this time prior to discharge. I have discussed lab and/or imaging findings with the patient and answered all questions/concerns to the best of my ability.I have discussed return precautions and OP follow up.    Rx / DC Orders ED Discharge Orders         Ordered    oxyCODONE-acetaminophen (PERCOCET) 5-325 MG tablet  Every 6 hours PRN     03/26/20 1245    tamsulosin (FLOMAX) 0.4 MG CAPS capsule  2 times daily     03/26/20 1245    ondansetron (ZOFRAN) 4 MG tablet  Every 8 hours PRN     03/26/20 1246    ketorolac (TORADOL) 10 MG tablet  Every 6 hours PRN     03/26/20 1330           Arthor Captain, PA-C 03/26/20 1639    Sabas Sous, MD 03/30/20 [phone]
